# Patient Record
Sex: Male | Born: 1980 | Race: Black or African American | Hispanic: No | Marital: Married | Smoking: Former smoker
Health system: Southern US, Community
[De-identification: ages and names within clinical notes are randomized; demographics above are authoritative.]

## PROBLEM LIST (undated history)

## (undated) DIAGNOSIS — F17200 Nicotine dependence, unspecified, uncomplicated: Secondary | ICD-10-CM

## (undated) HISTORY — DX: Nicotine dependence, unspecified, uncomplicated: F17.200

## (undated) HISTORY — PX: TYMPANOSTOMY TUBE PLACEMENT: SHX32

---
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
## 2000-05-14 ENCOUNTER — Encounter: Admission: RE | Admit: 2000-05-14 | Discharge: 2000-05-14 | Payer: Self-pay | Admitting: Family Medicine

## 2000-09-24 ENCOUNTER — Encounter: Admission: RE | Admit: 2000-09-24 | Discharge: 2000-09-24 | Payer: Self-pay | Admitting: Family Medicine

## 2007-01-09 ENCOUNTER — Encounter: Admission: RE | Admit: 2007-01-09 | Discharge: 2007-01-09 | Payer: Self-pay | Admitting: Gastroenterology

## 2007-01-23 ENCOUNTER — Emergency Department (HOSPITAL_COMMUNITY): Admission: EM | Admit: 2007-01-23 | Discharge: 2007-01-23 | Payer: Self-pay | Admitting: Emergency Medicine

## 2007-10-17 ENCOUNTER — Emergency Department (HOSPITAL_COMMUNITY): Admission: EM | Admit: 2007-10-17 | Discharge: 2007-10-17 | Payer: Self-pay | Admitting: Emergency Medicine

## 2007-11-05 ENCOUNTER — Encounter (HOSPITAL_COMMUNITY): Admission: RE | Admit: 2007-11-05 | Discharge: 2007-12-03 | Payer: Self-pay | Admitting: Emergency Medicine

## 2007-11-08 ENCOUNTER — Emergency Department (HOSPITAL_COMMUNITY): Admission: EM | Admit: 2007-11-08 | Discharge: 2007-11-08 | Payer: Self-pay | Admitting: Emergency Medicine

## 2008-03-16 ENCOUNTER — Emergency Department (HOSPITAL_COMMUNITY): Admission: EM | Admit: 2008-03-16 | Discharge: 2008-03-16 | Payer: Self-pay | Admitting: Emergency Medicine

## 2013-04-15 ENCOUNTER — Other Ambulatory Visit (INDEPENDENT_AMBULATORY_CARE_PROVIDER_SITE_OTHER): Payer: 59

## 2013-04-15 DIAGNOSIS — K219 Gastro-esophageal reflux disease without esophagitis: Secondary | ICD-10-CM

## 2013-04-15 DIAGNOSIS — Z0000 Encounter for general adult medical examination without abnormal findings: Secondary | ICD-10-CM

## 2013-04-16 ENCOUNTER — Ambulatory Visit (INDEPENDENT_AMBULATORY_CARE_PROVIDER_SITE_OTHER): Payer: 59 | Admitting: Family Medicine

## 2013-04-16 ENCOUNTER — Encounter: Payer: Self-pay | Admitting: Family Medicine

## 2013-04-16 VITALS — BP 130/80 | HR 62 | Temp 97.6°F | Resp 14 | Ht 71.0 in | Wt 199.0 lb

## 2013-04-16 DIAGNOSIS — Z0000 Encounter for general adult medical examination without abnormal findings: Secondary | ICD-10-CM

## 2013-04-16 DIAGNOSIS — F17200 Nicotine dependence, unspecified, uncomplicated: Secondary | ICD-10-CM | POA: Insufficient documentation

## 2013-04-16 LAB — COMPLETE METABOLIC PANEL WITH GFR
ALT: 27 U/L (ref 0–53)
AST: 35 U/L (ref 0–37)
Alkaline Phosphatase: 58 U/L (ref 39–117)
BUN: 12 mg/dL (ref 6–23)
CO2: 28 mEq/L (ref 19–32)
Calcium: 9.5 mg/dL (ref 8.4–10.5)
GFR, Est Non African American: 86 mL/min
Glucose, Bld: 86 mg/dL (ref 70–99)
Potassium: 5 mEq/L (ref 3.5–5.3)
Sodium: 140 mEq/L (ref 135–145)
Total Bilirubin: 1 mg/dL (ref 0.3–1.2)

## 2013-04-16 LAB — CBC WITH DIFFERENTIAL/PLATELET
Eosinophils Absolute: 0.2 10*3/uL (ref 0.0–0.7)
Hemoglobin: 15.5 g/dL (ref 13.0–17.0)
Lymphocytes Relative: 34 % (ref 12–46)
MCH: 30.8 pg (ref 26.0–34.0)
MCHC: 34.7 g/dL (ref 30.0–36.0)
Monocytes Relative: 8 % (ref 3–12)
Neutro Abs: 3.3 10*3/uL (ref 1.7–7.7)
Neutrophils Relative %: 54 % (ref 43–77)
RDW: 13.1 % (ref 11.5–15.5)

## 2013-04-16 LAB — LIPID PANEL
HDL: 57 mg/dL (ref >39)
LDL Cholesterol: 71 mg/dL (ref 0–99)
VLDL: 14 mg/dL (ref 0–40)

## 2013-04-16 NOTE — Progress Notes (Signed)
Subjective:    Patient ID: Nicholas Lee, male    DOB: May 08, 1981, 32 y.o.   MRN: [id number]  HPI  Here today for complete physical exam. The patient denies any current medical concerns. He is feeling well. He did have blood work drawn yesterday prior to the physical which is shown below. Lab on 04/15/2013  Component Date Value Range Status  . WBC 04/15/2013 6.0  4.0 - 10.5 K/uL Final  . RBC 04/15/2013 5.03  4.22 - 5.81 MIL/uL Final  . Hemoglobin 04/15/2013 15.5  13.0 - 17.0 g/dL Final  . HCT 40/98/1191 44.7  39.0 - 52.0 % Final  . MCV 04/15/2013 88.9  78.0 - 100.0 fL Final  . MCH 04/15/2013 30.8  26.0 - 34.0 pg Final  . MCHC 04/15/2013 34.7  30.0 - 36.0 g/dL Final  . RDW 47/82/9562 13.1  11.5 - 15.5 % Final  . Platelets 04/15/2013 146* 150 - 400 K/uL Final  . Neutrophils Relative % 04/15/2013 54  43 - 77 % Final  . Neutro Abs 04/15/2013 3.3  1.7 - 7.7 K/uL Final  . Lymphocytes Relative 04/15/2013 34  12 - 46 % Final  . Lymphs Abs 04/15/2013 2.1  0.7 - 4.0 K/uL Final  . Monocytes Relative 04/15/2013 8  3 - 12 % Final  . Monocytes Absolute 04/15/2013 0.5  0.1 - 1.0 K/uL Final  . Eosinophils Relative 04/15/2013 3  0 - 5 % Final  . Eosinophils Absolute 04/15/2013 0.2  0.0 - 0.7 K/uL Final  . Basophils Relative 04/15/2013 1  0 - 1 % Final  . Basophils Absolute 04/15/2013 0.0  0.0 - 0.1 K/uL Final  . Smear Review 04/15/2013 Criteria for review not met   Final  . Sodium 04/15/2013 140  135 - 145 mEq/L Final  . Potassium 04/15/2013 5.0  3.5 - 5.3 mEq/L Final  . Chloride 04/15/2013 104  96 - 112 mEq/L Final  . CO2 04/15/2013 28  19 - 32 mEq/L Final  . Glucose, Bld 04/15/2013 86  70 - 99 mg/dL Final  . BUN 13/06/6577 12  6 - 23 mg/dL Final  . Creat 46/96/2952 1.13  0.50 - 1.35 mg/dL Final  . Total Bilirubin 04/15/2013 1.0  0.3 - 1.2 mg/dL Final  . Alkaline Phosphatase 04/15/2013 58  39 - 117 U/L Final  . AST 04/15/2013 35  0 - 37 U/L Final  . ALT 04/15/2013 27  0 - 53 U/L Final  . Total  Protein 04/15/2013 6.6  6.0 - 8.3 g/dL Final  . Albumin 84/13/2440 4.4  3.5 - 5.2 g/dL Final  . Calcium 09/15/2535 9.5  8.4 - 10.5 mg/dL Final  . GFR, Est African American 04/15/2013 >89   Final  . GFR, Est Non African American 04/15/2013 86   Final   Comment:                            The estimated GFR is a calculation valid for adults (>=29 years old)                          that uses the CKD-EPI algorithm to adjust for age and sex. It is                            not to be used for children, pregnant women, hospitalized patients,  patients on dialysis, or with rapidly changing kidney function.                          According to the NKDEP, eGFR >89 is normal, 60-89 shows mild                          impairment, 30-59 shows moderate impairment, 15-29 shows severe                          impairment and <15 is ESRD.                             Marland Kitchen Cholesterol 04/15/2013 142  0 - 200 mg/dL Final   Comment: ATP III Classification:                                < 200        mg/dL        Desirable                               200 - 239     mg/dL        Borderline High                               >= 240        mg/dL        High                             . Triglycerides 04/15/2013 68  <150 mg/dL Final  . HDL 14/78/2956 57  >39 mg/dL Final  . Total CHOL/HDL Ratio 04/15/2013 2.5   Final  . VLDL 04/15/2013 14  0 - 40 mg/dL Final  . LDL Cholesterol 04/15/2013 71  0 - 99 mg/dL Final   Comment:                            Total Cholesterol/HDL Ratio:CHD Risk                                                 Coronary Heart Disease Risk Table                                                                 Men       Women                                   1/2 Average Risk              3.4        3.3  Average Risk              5.0        4.4                                    2X Average Risk              9.6        7.1                                     3X Average Risk             23.4       11.0                          Use the calculated Patient Ratio above and the CHD Risk table                           to determine the patient's CHD Risk.                          ATP III Classification (LDL):                                < 100        mg/dL         Optimal                               100 - 129     mg/dL         Near or Above Optimal                               130 - 159     mg/dL         Borderline High                               160 - 189     mg/dL         High                                > 190        mg/dL         Very High                              Past Medical History  Diagnosis Date  . Smoker    No current outpatient prescriptions on file prior to visit.   No current facility-administered medications on file prior to visit.   No Known Allergies History   Social History  . Marital Status: Single    Spouse Name: N/A    Number of Children: N/A  . Years of Education: N/A   Occupational History  . Not on file.   Social History Main Topics  . Smoking status:  Current Every Day Smoker    Types: Cigarettes  . Smokeless tobacco: Not on file     Comment: 1 pack per week  . Alcohol Use: Yes     Comment: 3-4 days per week  . Drug Use: No  . Sexually Active: Yes     Comment: married, works at [employer] in [employer]   Other Topics Concern  . Not on file   Social History Narrative  . No narrative on file   Family History  Problem Relation Age of Onset  . Heart disease Maternal Grandfather     14 y/o      Review of Systems  All other systems reviewed and are negative.       Objective:   Physical Exam  Vitals reviewed. Constitutional: He is oriented to person, place, and time. He appears well-developed and well-nourished.  HENT:  Head: Normocephalic and atraumatic.  Right Ear: External ear normal.  Left Ear: External ear normal.  Nose: Nose normal.   Mouth/Throat: Oropharynx is clear and moist. No oropharyngeal exudate.  Eyes: Conjunctivae and EOM are normal. Pupils are equal, round, and reactive to light. Right eye exhibits no discharge. Left eye exhibits no discharge. No scleral icterus.  Neck: Normal range of motion. Neck supple. No JVD present. No thyromegaly present.  Cardiovascular: Normal rate, regular rhythm and normal heart sounds.  Exam reveals no gallop and no friction rub.   No murmur heard. Pulmonary/Chest: Effort normal and breath sounds normal. No respiratory distress. He has no wheezes. He has no rales. He exhibits no tenderness.  Abdominal: Soft. Bowel sounds are normal. He exhibits no distension and no mass. There is no tenderness. There is no rebound and no guarding.  Musculoskeletal: Normal range of motion. He exhibits no edema and no tenderness.  Lymphadenopathy:    He has no cervical adenopathy.  Neurological: He is alert and oriented to person, place, and time. He has normal reflexes. He displays normal reflexes. No cranial nerve deficit. He exhibits normal muscle tone. Coordination normal.  Skin: Skin is warm and dry. No rash noted. No erythema. No pallor.  Psychiatric: He has a normal mood and affect. His behavior is normal. Judgment and thought content normal.          Assessment & Plan:  1. Routine general medical examination at a health care facility Patient's physical exam is normal. We went over his lab work in detail. His labs were significant only for a platelet count of 146. I recommended we check this annually. His immunizations are up to date.  He is due for his next tetanus shot in 5 years as he stated he had had 15 years ago. I recommended annual flu shot. His physical exam is otherwise normal. Followup again in one year.

## 2013-12-25 ENCOUNTER — Ambulatory Visit: Payer: 59 | Admitting: Family Medicine

## 2014-06-17 ENCOUNTER — Emergency Department (HOSPITAL_COMMUNITY)
Admission: EM | Admit: 2014-06-17 | Discharge: 2014-06-17 | Disposition: A | Discharge status: Home / Self Care | Payer: 59 | Source: Home / Self Care

## 2014-06-17 ENCOUNTER — Encounter (HOSPITAL_COMMUNITY): Payer: Self-pay | Admitting: Emergency Medicine

## 2014-06-17 DIAGNOSIS — K089 Disorder of teeth and supporting structures, unspecified: Secondary | ICD-10-CM

## 2014-06-17 DIAGNOSIS — K0889 Other specified disorders of teeth and supporting structures: Secondary | ICD-10-CM

## 2014-06-17 DIAGNOSIS — K044 Acute apical periodontitis of pulpal origin: Secondary | ICD-10-CM

## 2014-06-17 DIAGNOSIS — K047 Periapical abscess without sinus: Secondary | ICD-10-CM

## 2014-06-17 MED ORDER — AMOXICILLIN 500 MG PO CAPS: 1000.0000 mg | ORAL_CAPSULE | Freq: Two times a day (BID) | ORAL | Status: DC

## 2014-06-17 MED ORDER — HYDROCODONE-ACETAMINOPHEN 5-325 MG PO TABS: 1.0000 | ORAL_TABLET | ORAL | Status: DC | PRN

## 2014-06-17 NOTE — ED Notes (Signed)
Plan   Of  Care  Discussed   With  Pt   Importance  Of  Followup  With  A   Dentist      Reviewed  With  The  Pt

## 2014-06-17 NOTE — ED Notes (Signed)
Pt  Reports  Toothache  Which  Started  Yesterday      His  Face  Is  Now  Swollen     He  States  He  Called  A  Dentist  Who  Stated  They  Could  Not  See  Him   Till   The   Swelling  Goes   Down

## 2014-06-17 NOTE — ED Provider Notes (Signed)
CSN: [id number]     Arrival date & time 06/17/14  1217 History   First MD Initiated Contact with Patient 06/17/14 1302     Chief Complaint  Patient presents with  . Oral Swelling   (Consider location/radiation/quality/duration/timing/severity/associated sxs/prior Treatment) HPI Comments: 33 year old male complaining of swelling to the left side of the face associated with left lower third molar pain since yesterday.   Past Medical History  Diagnosis Date  . Smoker    Past Surgical History  Procedure Laterality Date  . Tympanostomy tube placement      left ear in 90's   Family History  Problem Relation Age of Onset  . Heart disease Maternal Grandfather     33 y/o   History  Substance Use Topics  . Smoking status: Current Every Day Smoker    Types: Cigarettes  . Smokeless tobacco: Not on file     Comment: 1 pack per week  . Alcohol Use: Yes     Comment: 3-4 days per week    Review of Systems  Constitutional: Negative.   HENT: Positive for dental problem. Negative for sore throat and trouble swallowing.   All other systems reviewed and are negative.   Allergies  Review of patient's allergies indicates no known allergies.  Home Medications   Prior to Admission medications   Medication Sig Start Date End Date Taking? Authorizing Provider  amoxicillin (AMOXIL) 500 MG capsule Take 2 capsules (1,000 mg total) by mouth 2 (two) times daily. 06/17/14   Hayden Rasmussenavid Galia Rahm, NP  HYDROcodone-acetaminophen (NORCO/VICODIN) 5-325 MG per tablet Take 1 tablet by mouth every 4 (four) hours as needed. 06/17/14   Hayden Rasmussenavid Mychelle Kendra, NP   BP 133/92  Pulse 100  Temp(Src) 99 F (37.2 C) (Oral)  Resp 12  SpO2 97% Physical Exam  Nursing note and vitals reviewed. Constitutional: He is oriented to person, place, and time. He appears well-developed and well-nourished. No distress.  HENT:  Mouth/Throat: Oropharynx is clear and moist. No oropharyngeal exudate.  Left lower third molar with large occlusal  surface cavity and abrasion. Associated swelling adjacent to the tooth that extends into the left face. No abscess is visible.  Eyes: Conjunctivae and EOM are normal.  Neck: Normal range of motion. Neck supple.  Pulmonary/Chest: Effort normal. No respiratory distress.  Lymphadenopathy:    He has no cervical adenopathy.  Neurological: He is alert and oriented to person, place, and time.  Skin: Skin is warm and dry.  Psychiatric: He has a normal mood and affect.    ED Course  Procedures (including critical care time) Labs Review Labs Reviewed - No data to display  Imaging Review No results found.   MDM   1. Toothache   2. Tooth infection    amoxiciillin norco 5mg  #15 See dentist as soon as possible    Hayden Rasmussenavid Willona Phariss, NP 06/17/14 1315

## 2014-06-18 NOTE — ED Provider Notes (Signed)
Medical screening examination/treatment/procedure(s) were performed by a resident physician or non-physician practitioner and as the supervising physician I was immediately available for consultation/collaboration.  Solace Manwarren, MD    Ashia Dehner S Anadelia Kintz, MD 06/18/14 0744 

## 2014-06-19 ENCOUNTER — Other Ambulatory Visit (HOSPITAL_COMMUNITY): Payer: 59

## 2014-06-19 ENCOUNTER — Inpatient Hospital Stay (HOSPITAL_COMMUNITY)
Admission: EM | Admit: 2014-06-19 | Discharge: 2014-06-21 | DRG: 159 | Disposition: A | Discharge status: Home / Self Care | Payer: 59 | Attending: Internal Medicine | Admitting: Internal Medicine

## 2014-06-19 ENCOUNTER — Emergency Department (HOSPITAL_COMMUNITY): Payer: 59

## 2014-06-19 ENCOUNTER — Encounter (HOSPITAL_COMMUNITY): Payer: Self-pay | Admitting: Emergency Medicine

## 2014-06-19 DIAGNOSIS — F17200 Nicotine dependence, unspecified, uncomplicated: Secondary | ICD-10-CM | POA: Diagnosis present

## 2014-06-19 DIAGNOSIS — R221 Localized swelling, mass and lump, neck: Secondary | ICD-10-CM | POA: Diagnosis present

## 2014-06-19 DIAGNOSIS — K047 Periapical abscess without sinus: Secondary | ICD-10-CM | POA: Diagnosis present

## 2014-06-19 DIAGNOSIS — K1230 Oral mucositis (ulcerative), unspecified: Secondary | ICD-10-CM

## 2014-06-19 DIAGNOSIS — E860 Dehydration: Secondary | ICD-10-CM | POA: Diagnosis present

## 2014-06-19 DIAGNOSIS — Z720 Tobacco use: Secondary | ICD-10-CM

## 2014-06-19 DIAGNOSIS — K121 Other forms of stomatitis: Secondary | ICD-10-CM | POA: Diagnosis present

## 2014-06-19 DIAGNOSIS — Z8249 Family history of ischemic heart disease and other diseases of the circulatory system: Secondary | ICD-10-CM | POA: Diagnosis not present

## 2014-06-19 DIAGNOSIS — R220 Localized swelling, mass and lump, head: Secondary | ICD-10-CM | POA: Diagnosis present

## 2014-06-19 LAB — CBC WITH DIFFERENTIAL/PLATELET
BASOS PCT: 0 % (ref 0–1)
Basophils Absolute: 0 10*3/uL (ref 0.0–0.1)
Eosinophils Absolute: 0 10*3/uL (ref 0.0–0.7)
Eosinophils Relative: 0 % (ref 0–5)
HCT: 41.8 % (ref 39.0–52.0)
HEMOGLOBIN: 15.2 g/dL (ref 13.0–17.0)
LYMPHS ABS: 1.4 10*3/uL (ref 0.7–4.0)
LYMPHS PCT: 15 % (ref 12–46)
MCH: 32.2 pg (ref 26.0–34.0)
MCHC: 36.4 g/dL — AB (ref 30.0–36.0)
MCV: 88.6 fL (ref 78.0–100.0)
MONOS PCT: 12 % (ref 3–12)
Monocytes Absolute: 1.1 10*3/uL — ABNORMAL HIGH (ref 0.1–1.0)
NEUTROS ABS: 6.6 10*3/uL (ref 1.7–7.7)
Neutrophils Relative %: 73 % (ref 43–77)
PLATELETS: 127 10*3/uL — AB (ref 150–400)
RBC: 4.72 MIL/uL (ref 4.22–5.81)
RDW: 11.9 % (ref 11.5–15.5)
WBC: 9.1 10*3/uL (ref 4.0–10.5)

## 2014-06-19 LAB — I-STAT CHEM 8, ED
BUN: 16 mg/dL (ref 6–23)
CALCIUM ION: 1.15 mmol/L (ref 1.12–1.23)
CHLORIDE: 98 meq/L (ref 96–112)
CREATININE: 1 mg/dL (ref 0.50–1.35)
Glucose, Bld: 94 mg/dL (ref 70–99)
HCT: 48 % (ref 39.0–52.0)
HEMOGLOBIN: 16.3 g/dL (ref 13.0–17.0)
Potassium: 4.1 mEq/L (ref 3.7–5.3)
Sodium: 137 mEq/L (ref 137–147)
TCO2: 27 mmol/L (ref 0–100)

## 2014-06-19 MED ORDER — ACETAMINOPHEN 650 MG RE SUPP: 650.0000 mg | Freq: Four times a day (QID) | RECTAL | Status: DC | PRN

## 2014-06-19 MED ORDER — SODIUM CHLORIDE 0.9 % IV BOLUS (SEPSIS)
1000.0000 mL | Freq: Once | INTRAVENOUS | Status: AC
  Administered 2014-06-19: 1000 mL via INTRAVENOUS

## 2014-06-19 MED ORDER — CLINDAMYCIN PHOSPHATE 600 MG/50ML IV SOLN
600.0000 mg | Freq: Once | INTRAVENOUS | Status: AC
  Administered 2014-06-19: 600 mg via INTRAVENOUS
  Filled 2014-06-19: qty 50

## 2014-06-19 MED ORDER — OXYCODONE HCL 5 MG PO TABS
5.0000 mg | ORAL_TABLET | ORAL | Status: DC | PRN
  Administered 2014-06-20 – 2014-06-21 (×2): 5 mg via ORAL
  Filled 2014-06-19 (×3): qty 1

## 2014-06-19 MED ORDER — IBUPROFEN 400 MG PO TABS
400.0000 mg | ORAL_TABLET | Freq: Three times a day (TID) | ORAL | Status: DC
  Administered 2014-06-19 – 2014-06-21 (×6): 400 mg via ORAL
  Filled 2014-06-19 (×11): qty 1

## 2014-06-19 MED ORDER — SODIUM CHLORIDE 0.9 % IV SOLN
3.0000 g | Freq: Four times a day (QID) | INTRAVENOUS | Status: DC
  Administered 2014-06-19 – 2014-06-21 (×8): 3 g via INTRAVENOUS
  Filled 2014-06-19 (×10): qty 3

## 2014-06-19 MED ORDER — ONDANSETRON HCL 4 MG/2ML IJ SOLN: 4.0000 mg | Freq: Four times a day (QID) | INTRAMUSCULAR | Status: DC | PRN

## 2014-06-19 MED ORDER — DOCUSATE SODIUM 100 MG PO CAPS
100.0000 mg | ORAL_CAPSULE | Freq: Two times a day (BID) | ORAL | Status: DC
  Administered 2014-06-19 – 2014-06-21 (×3): 100 mg via ORAL
  Filled 2014-06-19 (×4): qty 1

## 2014-06-19 MED ORDER — HEPARIN SODIUM (PORCINE) 5000 UNIT/ML IJ SOLN
5000.0000 [IU] | Freq: Three times a day (TID) | INTRAMUSCULAR | Status: DC
  Filled 2014-06-19 (×8): qty 1

## 2014-06-19 MED ORDER — SODIUM CHLORIDE 0.9 % IV SOLN: INTRAVENOUS | Status: AC

## 2014-06-19 MED ORDER — HYDROMORPHONE HCL PF 1 MG/ML IJ SOLN
1.0000 mg | INTRAMUSCULAR | Status: AC | PRN
  Administered 2014-06-19 (×2): 1 mg via INTRAVENOUS
  Filled 2014-06-19 (×2): qty 1

## 2014-06-19 MED ORDER — ALBUTEROL SULFATE (2.5 MG/3ML) 0.083% IN NEBU: 2.5000 mg | INHALATION_SOLUTION | RESPIRATORY_TRACT | Status: DC | PRN

## 2014-06-19 MED ORDER — ACETAMINOPHEN 325 MG PO TABS
650.0000 mg | ORAL_TABLET | Freq: Four times a day (QID) | ORAL | Status: DC | PRN
  Administered 2014-06-19: 650 mg via ORAL
  Filled 2014-06-19: qty 2

## 2014-06-19 MED ORDER — IOHEXOL 300 MG/ML  SOLN
75.0000 mL | Freq: Once | INTRAMUSCULAR | Status: AC | PRN
  Administered 2014-06-19: 75 mL via INTRAVENOUS

## 2014-06-19 MED ORDER — MORPHINE SULFATE 2 MG/ML IJ SOLN
1.0000 mg | INTRAMUSCULAR | Status: DC | PRN
  Administered 2014-06-19 – 2014-06-20 (×5): 1 mg via INTRAVENOUS
  Filled 2014-06-19 (×6): qty 1

## 2014-06-19 MED ORDER — SODIUM CHLORIDE 0.9 % IV SOLN
INTRAVENOUS | Status: DC
  Administered 2014-06-19 – 2014-06-20 (×2): via INTRAVENOUS

## 2014-06-19 MED ORDER — ONDANSETRON HCL 4 MG PO TABS: 4.0000 mg | ORAL_TABLET | Freq: Four times a day (QID) | ORAL | Status: DC | PRN

## 2014-06-19 MED ORDER — ONDANSETRON HCL 4 MG/2ML IJ SOLN
4.0000 mg | Freq: Three times a day (TID) | INTRAMUSCULAR | Status: DC | PRN
Start: 2014-06-19 — End: 2014-06-19

## 2014-06-19 NOTE — ED Notes (Signed)
[facility] [place] [facility] [address] but could not due to the infection.

## 2014-06-19 NOTE — ED Notes (Signed)
Pt. Stated, I'm having dental pain. I was in [facility] on thursday and given antibiotic and not helping and Im not able to go to work.

## 2014-06-19 NOTE — ED Provider Notes (Signed)
CSN: [id number]     Arrival date & time 06/19/14  1003 History  This chart was scribed for non-physician practitioner working with Shanna CiscoMegan E Docherty, MD, by Modena JanskyAlbert Thayil, ED Scribe. This patient was seen in room TR10C/TR10C and the patient's care was started at 10:20 AM.     Chief Complaint  Patient presents with  . Dental Pain   The history is provided by the patient.   HPI Comments: Nicholas Lee is a 33 y.o. male who presents to the Emergency Department complaining of constant moderate dental pain that started 3 days ago. He reports that he went to Urgent Care 2 days ago and was treated with an antibiotic, Amoxicillin. He states that the antibiotic provided no relief and is not able to go to work. He states that he has a dental appointment scheduled for the 13th of this month. He currenlty rates the severity of the pain as a 5/10. He describes the pain as a throbbing feeling. He states that the pain is worse at night. He reports that he has difficultly swallowing due to associated swelling.  Reports swelling is in the side of his face and the floor of his mouth.  Is unable to lower his jaw enough to eat or drink properly.  He states that he had a fever of 101 and a sore throat.   Past Medical History  Diagnosis Date  . Smoker    Past Surgical History  Procedure Laterality Date  . Tympanostomy tube placement      left ear in 90's   Family History  Problem Relation Age of Onset  . Heart disease Maternal Grandfather     33 y/o   History  Substance Use Topics  . Smoking status: Current Every Day Smoker    Types: Cigarettes  . Smokeless tobacco: Not on file     Comment: 1 pack per week  . Alcohol Use: Yes     Comment: 3-4 days per week    Review of Systems  Constitutional: Positive for fever.  HENT: Positive for dental problem, facial swelling, sore throat and trouble swallowing.   Respiratory: Negative for choking and shortness of breath.   Musculoskeletal: Negative for neck pain  and neck stiffness.  Skin: Negative for color change.  Allergic/Immunologic: Negative for immunocompromised state.  Neurological: Negative for headaches.  Hematological: Does not bruise/bleed easily.  All other systems reviewed and are negative.   Allergies  Review of patient's allergies indicates no known allergies.  Home Medications   Prior to Admission medications   Medication Sig Start Date End Date Taking? Authorizing Provider  amoxicillin (AMOXIL) 500 MG capsule Take 2 capsules (1,000 mg total) by mouth 2 (two) times daily. 06/17/14   Hayden Rasmussenavid Mabe, NP  HYDROcodone-acetaminophen (NORCO/VICODIN) 5-325 MG per tablet Take 1 tablet by mouth every 4 (four) hours as needed. 06/17/14   Hayden Rasmussenavid Mabe, NP   BP 137/87  Pulse 113  Temp(Src) 99.2 F (37.3 C) (Oral)  Resp 17  Ht 6' (1.829 m)  Wt 186 lb (84.369 kg)  BMI 25.22 kg/m2  SpO2 98% Physical Exam  Nursing note and vitals reviewed. Constitutional: He appears well-developed and well-nourished. No distress.  HENT:  Head: Normocephalic and atraumatic.  + Trismus, unable to lower jaw more than a few cm.  Oral exam somewhat limited secondary to this. Edema and tenderness over left mandible. Submandibular fullness and tenderness.  Eyes: Conjunctivae are normal.  Neck: Normal range of motion. Neck supple.  Cardiovascular: Normal rate.  Pulmonary/Chest: Effort normal. No stridor. He has no wheezes.  Neurological: He is alert.  Skin: He is not diaphoretic.    ED Course  Procedures (including critical care time) DIAGNOSTIC STUDIES: Oxygen Saturation is 98% on RA, normal by my interpretation.    COORDINATION OF CARE: 10:24 AM- Pt advised of plan for treatment which includes medication, radiology, and labs and pt agrees. 10:27 AM- Pt currently declines pain medication.   Labs Review Labs Reviewed  CBC WITH DIFFERENTIAL - Abnormal; Notable for the following:    MCHC 36.4 (*)    Platelets 127 (*)    Monocytes Absolute 1.1 (*)     All other components within normal limits  I-STAT CHEM 8, ED    Imaging Review Ct Soft Tissue Neck W Contrast  06/19/2014   CLINICAL DATA:  Left lower dental abscess, trismus, oral floor swelling and submandibular swelling.  EXAM: CT NECK WITH CONTRAST  TECHNIQUE: Multidetector CT imaging of the neck was performed using the standard protocol following the bolus administration of intravenous contrast.  CONTRAST:  75mL OMNIPAQUE IOHEXOL 300 MG/ML  SOLN  COMPARISON:  None.  FINDINGS: The visualized portions of the brain and orbits are unremarkable. Visualized paranasal sinuses are clear. Right mastoid air cells are clear. There is evidence of prior left canal wall down mastoidectomy with soft tissue in the mastoidectomy bowl and opacification of a few small residual mastoid air cells inferiorly.  The nasopharynx is unremarkable. There is mild asymmetric soft tissue fullness of the left tonsillar pillar without a mass seen. Left mandibular second molar caries are noted with mild surrounding lucency in the alveolar process of the mandible and tiny, 7 x 3 mm medial subperiosteal gas and fluid collection (series 207, image 52 and series 203, image 32). Inflammatory changes extend into the left floor of mouth and left submandibular space without drainable fluid collection seen. There is also left parapharyngeal edema.  Enlarged left submandibular lymph nodes measure up to 1.2 cm in short axis, likely reactive. Submental lymph nodes measure up to 8 mm in short axis. Left level II lymph nodes measure up to 1.1 cm. Subcentimeter, mildly asymmetric left level III and IV lymph nodes are also noted as well as mildly asymmetric left supraclavicular lymph nodes.  Thyroid, right submandibular gland, and both parotid glands are unremarkable. Mildly low attenuation of the left submandibular gland may reflect mild edema related to adjacent infectious process. Major vascular structures of the neck appear patent. Visualized lung  apices are clear. Cervical spine is unremarkable.  IMPRESSION: Inflammation in the left submandibular space and floor of mouth consistent with infection of dental origin arising from the left mandibular second molar. There is a 7 x 3 mm subperiosteal abscess along the lingual surface of the mandible.   Electronically Signed   By: Sebastian Ache   On: 06/19/2014 12:50     EKG Interpretation None      11:32 AM Dr Micheline Maze made aware of the patient.   1:38 PM Discussed pt with Dr Barbette Merino.   Recommends switching patient to unasyn, observing overnight in hospital.  He will be available for any worsening condition.     MDM   Final diagnoses:  Dental abscess   Pt with hx fevers, nontoxic appearance, presents with worsening dental abscess.  Tachycardic likely secondary to infection, pain, and mild dehydration.  Pt declined pain medication.  IVF, IV clindamycin given.  Clinically no airway concerns in ED- No SOB,  no stridor, handling oral secretions without  difficulty.  CT neck w contrast ordered given concern for deep space infection.  CT shows small abscess and large area of inflammation (please see results).  Discussed with Dr Barbette Merino, oral surgery.  Plan for admission to medicine for overnight for IV antibiotics and observation.  Admitted to [facility].      I personally performed the services described in this documentation, which was scribed in my presence. The recorded information has been reviewed and is accurate.   [place], PA-C 06/19/14 [phone]

## 2014-06-19 NOTE — ED Notes (Signed)
Returned from ct 

## 2014-06-19 NOTE — Progress Notes (Signed)
ANTIBIOTIC CONSULT NOTE - INITIAL  Pharmacy Consult for Unasyn Indication: Dental infection  No Known Allergies  Patient Measurements: Height: 6' (182.9 cm) Weight: 186 lb (84.369 kg) IBW/kg (Calculated) : 77.6 Adjusted Body Weight:   Vital Signs: Temp: 100.5 F (38.1 C) (08/01 1456) Temp src: Oral (08/01 1456) BP: 146/89 mmHg (08/01 1456) Pulse Rate: 78 (08/01 1456) Intake/Output from previous day:   Intake/Output from this shift:    Labs:  Recent Labs  06/19/14 1053 06/19/14 1115  WBC  --  9.1  HGB 16.3 15.2  PLT  --  127*  CREATININE 1.00  --    Estimated Creatinine Clearance: 115.3 ml/min (by C-G formula based on Cr of 1). No results found for this basename: VANCOTROUGH, VANCOPEAK, VANCORANDOM, GENTTROUGH, GENTPEAK, GENTRANDOM, TOBRATROUGH, TOBRAPEAK, TOBRARND, AMIKACINPEAK, AMIKACINTROU, AMIKACIN,  in the last 72 hours   Microbiology: No results found for this or any previous visit (from the past 720 hour(s)).  Medical History: Past Medical History  Diagnosis Date  . Smoker     Medications:  Scheduled:  . sodium chloride   Intravenous STAT  . ampicillin-sulbactam (UNASYN) IV  3 g Intravenous Q6H  . docusate sodium  100 mg Oral BID  . heparin  5,000 Units Subcutaneous 3 times per day  . ibuprofen  400 mg Oral TID   Infusions:  . sodium chloride     Assessment: 33 yo who came in facial swelling. Unasyn is now being started for dental abscess.   Goal of Therapy:  Appropriate dose  Plan:   Unasyn 3g IV q6  Ulyses SouthwardMinh Pham, PharmD Pager: [phone] 06/19/2014 3:10 PM

## 2014-06-19 NOTE — H&P (Signed)
[facility] History and Physical  Nicholas Melnickric A Hagberg ZOX:[id number] DOB: 07/28/1981 DOA: 06/19/2014   PCP: He has no PCP Specialists: None  Chief Complaint: Facial swelling  HPI: Nicholas Lee is a 33 y.o. male with no significant past medical history except that he smokes who was in his usual state of health this past Wednesday, when he started experiencing pain in his mouth and the left side of his face. He started noticing swelling in the left side of his face. Subsequently, his throat started swelling. He had difficulty eating. He had difficulty swallowing initially solids and subsequently liquids. He had decreased appetite. He had fever up to 101F. Had chills. Pain was 6-7/10 in intensity. It is a sharp pain without any radiation. He's never had this problem before. Denies any shortness of breath or chest pains. No nausea, vomiting, diarrhea. He went to the urgent care Center on Thursday, and was diagnosed with a dental abscess and was prescribed amoxicillin. He hasn't experienced any improvement so, he decided to come back to the hospital today. He denies any drooling of saliva. He has been able to swallow water. However, it is quite painful.  Home Medications: Prior to Admission medications   Medication Sig Start Date End Date Taking? Authorizing Provider  amoxicillin (AMOXIL) 500 MG capsule Take 2 capsules (1,000 mg total) by mouth 2 (two) times daily. 06/17/14  Yes Hayden Rasmussenavid Mabe, NP  GINSENG PO Take 1 Bottle by mouth once a week.   Yes Historical Provider, MD  HYDROcodone-acetaminophen (NORCO/VICODIN) 5-325 MG per tablet Take 1 tablet by mouth every 4 (four) hours as needed. 06/17/14  Yes Hayden Rasmussenavid Mabe, NP  Multiple Vitamin (MULTIVITAMIN WITH MINERALS) TABS tablet Take 1 tablet by mouth daily.   Yes Historical Provider, MD    Allergies: No Known Allergies  Past Medical History: Past Medical History  Diagnosis Date  . Smoker     Past Surgical History  Procedure Laterality Date  .  Tympanostomy tube placement      left ear in 90's    Social History: [place]. He as a Estate agentforklift operator. Smokes 5 cigarettes a day. 12 packs of beer over a week. Last alcohol consumption was on Monday. Independent with daily activities.  Family History:  Family History  Problem Relation Age of Onset  . Heart disease Maternal Grandfather     33 y/o  . Heart disease Mother      Review of Systems - History obtained from the patient General ROS: positive for  - chills, fatigue and fever Psychological ROS: negative Ophthalmic ROS: negative ENT ROS: negative Allergy and Immunology ROS: negative Hematological and Lymphatic ROS: negative Endocrine ROS: negative Respiratory ROS: no cough, shortness of breath, or wheezing Cardiovascular ROS: no chest pain or dyspnea on exertion Gastrointestinal ROS: no abdominal pain, change in bowel habits, or black or bloody stools Genito-Urinary ROS: no dysuria, trouble voiding, or hematuria Musculoskeletal ROS: negative Neurological ROS: no TIA or stroke symptoms Dermatological ROS: negative  Physical Examination  Filed Vitals:   06/19/14 1215 06/19/14 1230 06/19/14 1315 06/19/14 1347  BP: 135/84 157/83 138/83 137/81  Pulse: 81 83 79   Temp:      TempSrc:      Resp: 18 17 17 18   Height:      Weight:      SpO2: 100% 99% 100% 100%    BP 137/81  Pulse 79  Temp(Src) 99.3 F (37.4 C) (Oral)  Resp 18  Ht  6' (1.829 m)  Wt 84.369 kg (186 lb)  BMI 25.22 kg/m2  SpO2 100%  General appearance: alert, cooperative, appears stated age and no distress Head: Normocephalic, without obvious abnormality, atraumatic, swollen face. Swelling and erythema along left face with tenderness. No fluctuation. Eyes: conjunctivae/corneas clear. PERRL, EOM's intact. Fundi benign. Throat: due to swelling it was difficult to examine the throat. But uvula was seen. No masses noted. Neck: some swelling and warmth noted along left side.  No fluctuation. Resp: clear to auscultation bilaterally Cardio: regular rate and rhythm, S1, S2 normal, no murmur, click, rub or gallop GI: soft, non-tender; bowel sounds normal; no masses,  no organomegaly Extremities: extremities normal, atraumatic, no cyanosis or edema Pulses: 2+ and symmetric Skin: Skin color, texture, turgor normal. No rashes or lesions Lymph nodes: enlarged Cervical LN.  Neurologic: No focal deficits  Laboratory Data: Results for orders placed during the hospital encounter of 06/19/14 (from the past 48 hour(s))  I-STAT CHEM 8, ED     Status: None   Collection Time    06/19/14 10:53 AM      Result Value Ref Range   Sodium 137  137 - 147 mEq/L   Potassium 4.1  3.7 - 5.3 mEq/L   Chloride 98  96 - 112 mEq/L   BUN 16  6 - 23 mg/dL   Creatinine, Ser 1.61  0.50 - 1.35 mg/dL   Glucose, Bld 94  70 - 99 mg/dL   Calcium, Ion 0.96  0.45 - 1.23 mmol/L   TCO2 27  0 - 100 mmol/L   Hemoglobin 16.3  13.0 - 17.0 g/dL   HCT 40.9  81.1 - 91.4 %  CBC WITH DIFFERENTIAL     Status: Abnormal   Collection Time    06/19/14 11:15 AM      Result Value Ref Range   WBC 9.1  4.0 - 10.5 K/uL   RBC 4.72  4.22 - 5.81 MIL/uL   Hemoglobin 15.2  13.0 - 17.0 g/dL   HCT 78.2  95.6 - 21.3 %   MCV 88.6  78.0 - 100.0 fL   MCH 32.2  26.0 - 34.0 pg   MCHC 36.4 (*) 30.0 - 36.0 g/dL   RDW 08.6  57.8 - 46.9 %   Platelets 127 (*) 150 - 400 K/uL   Neutrophils Relative % 73  43 - 77 %   Neutro Abs 6.6  1.7 - 7.7 K/uL   Lymphocytes Relative 15  12 - 46 %   Lymphs Abs 1.4  0.7 - 4.0 K/uL   Monocytes Relative 12  3 - 12 %   Monocytes Absolute 1.1 (*) 0.1 - 1.0 K/uL   Eosinophils Relative 0  0 - 5 %   Eosinophils Absolute 0.0  0.0 - 0.7 K/uL   Basophils Relative 0  0 - 1 %   Basophils Absolute 0.0  0.0 - 0.1 K/uL    Radiology Reports: Ct Soft Tissue Neck W Contrast  06/19/2014   CLINICAL DATA:  Left lower dental abscess, trismus, oral floor swelling and submandibular swelling.  EXAM: CT NECK  WITH CONTRAST  TECHNIQUE: Multidetector CT imaging of the neck was performed using the standard protocol following the bolus administration of intravenous contrast.  CONTRAST:  75mL OMNIPAQUE IOHEXOL 300 MG/ML  SOLN  COMPARISON:  None.  FINDINGS: The visualized portions of the brain and orbits are unremarkable. Visualized paranasal sinuses are clear. Right mastoid air cells are clear. There is evidence of prior left canal wall down  mastoidectomy with soft tissue in the mastoidectomy bowl and opacification of a few small residual mastoid air cells inferiorly.  The nasopharynx is unremarkable. There is mild asymmetric soft tissue fullness of the left tonsillar pillar without a mass seen. Left mandibular second molar caries are noted with mild surrounding lucency in the alveolar process of the mandible and tiny, 7 x 3 mm medial subperiosteal gas and fluid collection (series 207, image 52 and series 203, image 32). Inflammatory changes extend into the left floor of mouth and left submandibular space without drainable fluid collection seen. There is also left parapharyngeal edema.  Enlarged left submandibular lymph nodes measure up to 1.2 cm in short axis, likely reactive. Submental lymph nodes measure up to 8 mm in short axis. Left level II lymph nodes measure up to 1.1 cm. Subcentimeter, mildly asymmetric left level III and IV lymph nodes are also noted as well as mildly asymmetric left supraclavicular lymph nodes.  Thyroid, right submandibular gland, and both parotid glands are unremarkable. Mildly low attenuation of the left submandibular gland may reflect mild edema related to adjacent infectious process. Major vascular structures of the neck appear patent. Visualized lung apices are clear. Cervical spine is unremarkable.  IMPRESSION: Inflammation in the left submandibular space and floor of mouth consistent with infection of dental origin arising from the left mandibular second molar. There is a 7 x 3 mm  subperiosteal abscess along the lingual surface of the mandible.   Electronically Signed   By: Sebastian Ache   On: 06/19/2014 12:50    Problem List  Principal Problem:   Dental abscess Active Problems:   Smoker   Oral inflammation   Assessment: This is a 33 year old, African American male, who presents with facial swelling, painful swallowing and appears to have a dental abscess with inflammation involving the left submandibular space and floor of mouth. He is maintaining his airway at this time.  Plan: #1 dental abscess with the inflammation in the oral cavity and submandibular space: There is a small subperiosteal abscess in the lingual surface of the mandible. No abscess in the retropharyngeal space. The case has been discussed with Dr. Ocie Doyne with oral surgery. Recommendation is to give Unasyn. No acute indication for surgery. If the patient improves, he can followup with Dr. Randa Evens office. He'll be kept on clear liquids. He'll be given anti-inflammatory agents.  #2 tobacco abuse: He has been counseled. He hasn't smoked in the last few days. Nicotine patch if needed.  #3 alcohol use: He drinks regularly, but not excessively. Watch for signs and symptoms of withdrawal, although risk for the same is low. His last consumption was on Monday.  DVT Prophylaxis: Heparin Code Status: Full code Family Communication: Discussed with the patient and his wife  Disposition Plan: Admit to MedSurg   Further management decisions will depend on results of further testing and patient's response to treatment.   [facility]  [facility] Pager [phone]  If 7PM-7AM, please contact night-coverage [email or web address] Password [email or web address]  06/19/2014, 2:21 PM  Disclaimer: This note was dictated with voice recognition software. Similar sounding words can inadvertently be transcribed and may not be corrected upon review.

## 2014-06-19 NOTE — ED Notes (Signed)
Pt in ct 

## 2014-06-20 LAB — COMPREHENSIVE METABOLIC PANEL
ALBUMIN: 2.9 g/dL — AB (ref 3.5–5.2)
ALT: 14 U/L (ref 0–53)
AST: 17 U/L (ref 0–37)
Alkaline Phosphatase: 55 U/L (ref 39–117)
Anion gap: 12 (ref 5–15)
BUN: 11 mg/dL (ref 6–23)
CALCIUM: 8.3 mg/dL — AB (ref 8.4–10.5)
CO2: 27 meq/L (ref 19–32)
Chloride: 101 mEq/L (ref 96–112)
Creatinine, Ser: 0.93 mg/dL (ref 0.50–1.35)
GFR calc Af Amer: >90 mL/min (ref >90)
GFR calc non Af Amer: >90 mL/min (ref >90)
Glucose, Bld: 105 mg/dL — ABNORMAL HIGH (ref 70–99)
Potassium: 3.7 mEq/L (ref 3.7–5.3)
SODIUM: 140 meq/L (ref 137–147)
TOTAL PROTEIN: 6.2 g/dL (ref 6.0–8.3)
Total Bilirubin: 0.6 mg/dL (ref 0.3–1.2)

## 2014-06-20 LAB — CBC
HCT: 36.8 % — ABNORMAL LOW (ref 39.0–52.0)
HEMOGLOBIN: 13.1 g/dL (ref 13.0–17.0)
MCH: 31.2 pg (ref 26.0–34.0)
MCHC: 35.6 g/dL (ref 30.0–36.0)
MCV: 87.6 fL (ref 78.0–100.0)
Platelets: 147 10*3/uL — ABNORMAL LOW (ref 150–400)
RBC: 4.2 MIL/uL — AB (ref 4.22–5.81)
RDW: 12.1 % (ref 11.5–15.5)
WBC: 6.3 10*3/uL (ref 4.0–10.5)

## 2014-06-20 NOTE — ED Provider Notes (Signed)
Medical screening examination/treatment/procedure(s) were performed by non-physician practitioner and as supervising physician I was immediately available for consultation/collaboration.  Megan E Docherty, MD 06/20/14 0716 

## 2014-06-20 NOTE — Progress Notes (Signed)
[facility] Progress Note   Nicholas Lee UJW:[id number] DOB: 07/17/1981 DOA: 06/19/2014 PCP: Leo Grosser, MD  Brief narrative: Nicholas Lee is a 33 y.o. male  presents with a left subperiosteal abscess of the mandible originating in left mandibular second molar. He did not improve with oral  Amoxil prescribed on Thursday at an urgent care. He has a Education officer, community and will f/u this coming Thursday. The case was discussed with Dr. Ocie Doyne with oral surgery. Recommendation is to give Unasyn.    Subjective: Swelling improving- tolerated clears.   Assessment/Plan: Principal Problem:   Dental abscess - con Unasyn- plan to d/c on Augmentin tomorrow - advance to full liquids  Active Problems:   Smoker - smoking cessation advised    Code Status: Full code Family Communication: sig other at bedside Disposition Plan: home tomorrow  Consultants: none  Procedures: none  Antibiotics: Anti-infectives   Start     Dose/Rate Route Frequency Ordered Stop   06/19/14 1600  Ampicillin-Sulbactam (UNASYN) 3 g in sodium chloride 0.9 % 100 mL IVPB     3 g 100 mL/hr over 60 Minutes Intravenous Every 6 hours 06/19/14 1459     06/19/14 1045  clindamycin (CLEOCIN) IVPB 600 mg    Comments:  Dental abscess   600 mg 100 mL/hr over 30 Minutes Intravenous  Once 06/19/14 1036 06/19/14 1213       DVT prophylaxis: Heparin  Objective: Filed Weights   06/19/14 1012  Weight: 84.369 kg (186 lb)    Intake/Output Summary (Last 24 hours) at 06/20/14 1040 Last data filed at 06/20/14 0600  Gross per 24 hour  Intake 2262.5 ml  Output    900 ml  Net 1362.5 ml     Vitals Filed Vitals:   06/19/14 1430 06/19/14 1456 06/19/14 2141 06/20/14 0521  BP: 135/77 146/89 125/74 109/66  Pulse: 84 78 77 79  Temp:  100.5 F (38.1 C) 98 F (36.7 C) 98.4 F (36.9 C)  TempSrc:  Oral Oral Oral  Resp: 14  16 17   Height:      Weight:      SpO2: 99% 98% 97% 99%    Exam: General: No acute  respiratory distress HEENT: swelling and tenderness in left mandible and neck Lungs: Clear to auscultation bilaterally without wheezes or crackles Cardiovascular: Regular rate and rhythm without murmur gallop or rub normal S1 and S2 Abdomen: Nontender, nondistended, soft, bowel sounds positive, no rebound, no ascites, no appreciable mass Extremities: No significant cyanosis, clubbing, or edema bilateral lower extremities  Data Reviewed: Basic Metabolic Panel:  Recent Labs Lab 06/19/14 1053 06/20/14 0255  NA 137 140  K 4.1 3.7  CL 98 101  CO2  --  27  GLUCOSE 94 105*  BUN 16 11  CREATININE 1.00 0.93  CALCIUM  --  8.3*   Liver Function Tests:  Recent Labs Lab 06/20/14 0255  AST 17  ALT 14  ALKPHOS 55  BILITOT 0.6  PROT 6.2  ALBUMIN 2.9*   No results found for this basename: LIPASE, AMYLASE,  in the last 168 hours No results found for this basename: AMMONIA,  in the last 168 hours CBC:  Recent Labs Lab 06/19/14 1053 06/19/14 1115 06/20/14 0255  WBC  --  9.1 6.3  NEUTROABS  --  6.6  --   HGB 16.3 15.2 13.1  HCT 48.0 41.8 36.8*  MCV  --  88.6 87.6  PLT  --  127* 147*   Cardiac Enzymes: No results found  for this basename: CKTOTAL, CKMB, CKMBINDEX, TROPONINI,  in the last 168 hours BNP (last 3 results) No results found for this basename: PROBNP,  in the last 8760 hours CBG: No results found for this basename: GLUCAP,  in the last 168 hours  No results found for this or any previous visit (from the past 240 hour(s)).   Studies:  Recent x-ray studies have been reviewed in detail by the Attending Physician  Scheduled Meds:  Scheduled Meds: . sodium chloride   Intravenous STAT  . ampicillin-sulbactam (UNASYN) IV  3 g Intravenous Q6H  . docusate sodium  100 mg Oral BID  . heparin  5,000 Units Subcutaneous 3 times per day  . ibuprofen  400 mg Oral TID   Continuous Infusions: . sodium chloride 75 mL/hr at 06/20/14 0847    Time spent on care of this  patient: 30 min   Jorge Amparo, MD 06/20/2014, 10:40 AM  LOS: 1 day   [facility] Office  [phone] Pager - Text Page per [email or web address]  If 7PM-7AM, please contact night-coverage [email or web address]

## 2014-06-20 NOTE — Progress Notes (Signed)
Patient refused his heparin both scheduled doses on my shift.

## 2014-06-21 MED ORDER — AMOXICILLIN-POT CLAVULANATE 875-125 MG PO TABS: 1.0000 | ORAL_TABLET | Freq: Two times a day (BID) | ORAL | Status: DC

## 2014-06-21 MED ORDER — IBUPROFEN 400 MG PO TABS: 400.0000 mg | ORAL_TABLET | Freq: Three times a day (TID) | ORAL | Status: DC

## 2014-06-21 MED ORDER — AMOXICILLIN-POT CLAVULANATE 875-125 MG PO TABS
1.0000 | ORAL_TABLET | Freq: Two times a day (BID) | ORAL | Status: DC
  Administered 2014-06-21: 1 via ORAL
  Filled 2014-06-21: qty 1

## 2014-06-21 MED ORDER — DSS 100 MG PO CAPS: 100.0000 mg | ORAL_CAPSULE | Freq: Two times a day (BID) | ORAL | Status: DC

## 2014-06-21 MED ORDER — HYDROCODONE-ACETAMINOPHEN 5-325 MG PO TABS: 1.0000 | ORAL_TABLET | ORAL | Status: DC | PRN

## 2014-06-21 NOTE — Discharge Instructions (Signed)
Continue the Motrin along with other medications- it will decrease the swelling in your face.  See your dentist ASAP    Abscessed Tooth An abscessed tooth is an infection around your tooth. It may be caused by holes or damage to the tooth (cavity) or a dental disease. An abscessed tooth causes mild to very bad pain in and around the tooth. See your dentist right away if you have tooth or gum pain. HOME CARE  Take your medicine as told. Finish it even if you start to feel better.  Do not drive after taking pain medicine.  Rinse your mouth (gargle) often with salt water ( teaspoon salt in 8 ounces of warm water).  Do not apply heat to the outside of your face. GET HELP RIGHT AWAY IF:   You have a temperature by mouth above 102 F (38.9 C), not controlled by medicine.  You have chills and a very bad headache.  You have problems breathing or swallowing.  Your mouth will not open.  You develop puffiness (swelling) on the neck or around the eye.  Your pain is not helped by medicine.  Your pain is getting worse instead of better. MAKE SURE YOU:   Understand these instructions.  Will watch your condition.  Will get help right away if you are not doing well or get worse. Document Released: 04/23/2008 Document Revised: 01/28/2012 Document Reviewed: 02/13/2011 [facility] Patient Information 2015 [employer] [place]. This information is not intended to replace advice given to you by your health care provider. Make sure you discuss any questions you have with your health care provider.

## 2014-06-21 NOTE — Progress Notes (Signed)
AVS discharge instructions were given and went over with patient. Patient was given prescriptions for augmentin, vicodin, and ibuprofen to take to his pharmacy. Patient was also given a doctor's note from Dr. Butler Denmarkizwan. Patient ststed that he did not have any questions. Now patient is getting IV antibiotic unasyn before he goes home.

## 2014-07-07 ENCOUNTER — Emergency Department (HOSPITAL_COMMUNITY): Payer: 59

## 2014-07-07 ENCOUNTER — Emergency Department (HOSPITAL_COMMUNITY)
Admission: EM | Admit: 2014-07-07 | Discharge: 2014-07-07 | Disposition: A | Discharge status: Home / Self Care | Payer: 59 | Attending: Emergency Medicine | Admitting: Emergency Medicine

## 2014-07-07 ENCOUNTER — Encounter (HOSPITAL_COMMUNITY): Payer: Self-pay | Admitting: Emergency Medicine

## 2014-07-07 DIAGNOSIS — R05 Cough: Secondary | ICD-10-CM | POA: Diagnosis not present

## 2014-07-07 DIAGNOSIS — Z87891 Personal history of nicotine dependence: Secondary | ICD-10-CM | POA: Insufficient documentation

## 2014-07-07 DIAGNOSIS — Z791 Long term (current) use of non-steroidal anti-inflammatories (NSAID): Secondary | ICD-10-CM | POA: Diagnosis not present

## 2014-07-07 DIAGNOSIS — Z792 Long term (current) use of antibiotics: Secondary | ICD-10-CM | POA: Insufficient documentation

## 2014-07-07 DIAGNOSIS — Z79899 Other long term (current) drug therapy: Secondary | ICD-10-CM | POA: Diagnosis not present

## 2014-07-07 DIAGNOSIS — R059 Cough, unspecified: Secondary | ICD-10-CM | POA: Insufficient documentation

## 2014-07-07 DIAGNOSIS — F411 Generalized anxiety disorder: Secondary | ICD-10-CM | POA: Diagnosis not present

## 2014-07-07 DIAGNOSIS — M436 Torticollis: Secondary | ICD-10-CM | POA: Diagnosis not present

## 2014-07-07 DIAGNOSIS — J029 Acute pharyngitis, unspecified: Secondary | ICD-10-CM | POA: Insufficient documentation

## 2014-07-07 DIAGNOSIS — R29898 Other symptoms and signs involving the musculoskeletal system: Secondary | ICD-10-CM

## 2014-07-07 LAB — CBC WITH DIFFERENTIAL/PLATELET
BASOS PCT: 1 % (ref 0–1)
Basophils Absolute: 0 10*3/uL (ref 0.0–0.1)
Eosinophils Absolute: 0.2 10*3/uL (ref 0.0–0.7)
Eosinophils Relative: 2 % (ref 0–5)
HCT: 38.5 % — ABNORMAL LOW (ref 39.0–52.0)
HEMOGLOBIN: 14.2 g/dL (ref 13.0–17.0)
Lymphocytes Relative: 41 % (ref 12–46)
Lymphs Abs: 2.7 10*3/uL (ref 0.7–4.0)
MCH: 31.4 pg (ref 26.0–34.0)
MCHC: 36.9 g/dL — AB (ref 30.0–36.0)
MCV: 85.2 fL (ref 78.0–100.0)
Monocytes Absolute: 0.5 10*3/uL (ref 0.1–1.0)
Monocytes Relative: 7 % (ref 3–12)
NEUTROS ABS: 3.2 10*3/uL (ref 1.7–7.7)
NEUTROS PCT: 49 % (ref 43–77)
Platelets: 216 10*3/uL (ref 150–400)
RBC: 4.52 MIL/uL (ref 4.22–5.81)
RDW: 11.9 % (ref 11.5–15.5)
WBC: 6.5 10*3/uL (ref 4.0–10.5)

## 2014-07-07 LAB — BASIC METABOLIC PANEL
ANION GAP: 12 (ref 5–15)
BUN: 10 mg/dL (ref 6–23)
CO2: 27 mEq/L (ref 19–32)
Calcium: 9.3 mg/dL (ref 8.4–10.5)
Chloride: 103 mEq/L (ref 96–112)
Creatinine, Ser: 0.88 mg/dL (ref 0.50–1.35)
GFR calc non Af Amer: >90 mL/min (ref >90)
Glucose, Bld: 90 mg/dL (ref 70–99)
POTASSIUM: 3.9 meq/L (ref 3.7–5.3)
Sodium: 142 mEq/L (ref 137–147)

## 2014-07-07 MED ORDER — IOHEXOL 300 MG/ML  SOLN
75.0000 mL | Freq: Once | INTRAMUSCULAR | Status: AC | PRN
  Administered 2014-07-07: 75 mL via INTRAVENOUS

## 2014-07-07 NOTE — ED Provider Notes (Signed)
CSN: [id number]     Arrival date & time 07/07/14  2440 History   First MD Initiated Contact with Patient 07/07/14 [phone]     Chief Complaint  Patient presents with  . Sore Throat     (Consider location/radiation/quality/duration/timing/severity/associated sxs/prior Treatment) HPI Comments: Patient with recent dental infection treated with IV antibiotics and dental extraction presents with continued sensation of tightness in his neck for the past one week. He has not had any difficulties with breathing or swallowing. He has not had fever or neck pain. No pain with movement of his neck. No chest pain. Patient stopped smoking and has had occasional cough and trouble producing mucus. No treatments prior to arrival. Onset of symptoms gradual course is constant. Nothing makes symptoms better or worse.  The history is provided by the patient and medical records.    Past Medical History  Diagnosis Date  . Smoker    Past Surgical History  Procedure Laterality Date  . Tympanostomy tube placement      left ear in 90's   Family History  Problem Relation Age of Onset  . Heart disease Maternal Grandfather     53 y/o  . Heart disease Mother    History  Substance Use Topics  . Smoking status: Former Smoker -- 0.25 packs/day    Types: Cigarettes  . Smokeless tobacco: Not on file     Comment: 1 pack per week  . Alcohol Use: Yes     Comment: 3-4 days per week    Review of Systems  Constitutional: Negative for fever.  HENT: Negative for facial swelling, rhinorrhea, sore throat and trouble swallowing.   Eyes: Negative for redness.  Respiratory: Positive for cough.   Cardiovascular: Negative for chest pain.  Gastrointestinal: Negative for nausea, vomiting, abdominal pain and diarrhea.  Genitourinary: Negative for dysuria.  Musculoskeletal: Positive for neck stiffness (neck tightness, no pain with movement). Negative for myalgias.  Skin: Negative for rash.  Neurological: Negative for  headaches.      Allergies  Review of patient's allergies indicates no known allergies.  Home Medications   Prior to Admission medications   Medication Sig Start Date End Date Taking? Authorizing Provider  amoxicillin-clavulanate (AUGMENTIN) 875-125 MG per tablet Take 1 tablet by mouth 2 (two) times daily. 06/21/14   Calvert Cantor, MD  docusate sodium 100 MG CAPS Take 100 mg by mouth 2 (two) times daily. 06/21/14   Calvert Cantor, MD  GINSENG PO Take 1 Bottle by mouth once a week.    Historical Provider, MD  HYDROcodone-acetaminophen (NORCO/VICODIN) 5-325 MG per tablet Take 1 tablet by mouth every 4 (four) hours as needed. 06/21/14   Calvert Cantor, MD  ibuprofen (ADVIL,MOTRIN) 400 MG tablet Take 1 tablet (400 mg total) by mouth 3 (three) times daily. 06/21/14   Calvert Cantor, MD  Multiple Vitamin (MULTIVITAMIN WITH MINERALS) TABS tablet Take 1 tablet by mouth daily.    Historical Provider, MD   BP 125/84  Pulse 70  Temp(Src) 98.7 F (37.1 C) (Oral)  Resp 20  SpO2 99%  Physical Exam  Nursing note and vitals reviewed. Constitutional: He appears well-developed and well-nourished.  HENT:  Head: Normocephalic and atraumatic.  Right Ear: External ear normal.  Left Ear: External ear normal.  Nose: Nose normal.  Mouth/Throat: Oropharynx is clear and moist.  Multiple dental extractions. No dental abscess.   Eyes: Conjunctivae are normal. Pupils are equal, round, and reactive to light. Right eye exhibits no discharge. Left eye exhibits no discharge.  Neck: Normal range of motion. Neck supple.  Full ROM in all 6 directions without pain  Cardiovascular: Normal rate, regular rhythm and normal heart sounds.   Pulmonary/Chest: Effort normal and breath sounds normal. No respiratory distress. He has no wheezes. He has no rales.  Abdominal: Soft. There is no tenderness.  Lymphadenopathy:    He has no cervical adenopathy.  Neurological: He is alert.  Skin: Skin is warm and dry.  Psychiatric: His mood  appears anxious.    ED Course  Procedures (including critical care time) Labs Review Labs Reviewed  CBC WITH DIFFERENTIAL - Abnormal; Notable for the following:    HCT 38.5 (*)    MCHC 36.9 (*)    All other components within normal limits  BASIC METABOLIC PANEL    Imaging Review Ct Soft Tissue Neck W Contrast  07/07/2014   CLINICAL DATA:  Continued throat pressure after recent dental abscess. Interval resolution of facial swelling.  EXAM: CT NECK WITH CONTRAST  TECHNIQUE: Multidetector CT imaging of the neck was performed using the standard protocol following the bolus administration of intravenous contrast.  CONTRAST:  75mL OMNIPAQUE IOHEXOL 300 MG/ML  SOLN  COMPARISON:  06/19/2014  FINDINGS: The visualized portion of the brain is unremarkable. Orbits are unremarkable. Mild mucosal thickening is noted in the right sphenoid sinus and left ethmoid air cells. Sequelae of prior left canal wall down mastoidectomy are again identified. Opacification of residual inferior left-sided mastoid air cells is unchanged.  The nasopharynx is unremarkable. Mild asymmetric soft tissue fullness of the left palatine tonsillar region is similar to the prior study without a discrete mass seen. There has been interval left second and third mandibular molar extraction with prominent lucency and small amount of gas in the mandible at the extraction sites. The tiny subperiosteal gas and fluid collection along the medial aspect of the left mandible on the prior study is no longer seen. No new fluid collection is identified. Inflammatory changes in the left floor of mouth and submandibular space have largely resolved.  The right mandibular second molar has also been extracted in the interim. Additionally, the right maxillary second bicuspid has also been extracted, and there is a very small amount of slightly asymmetric edema/fluid extending along the adjacent lingual surface of the maxilla measuring up to 4 mm in thickness  (series 2, image 44). No drainable fluid collection is identified.  The submandibular, parotid, and thyroid glands are normal in appearance. Level I and II lymph nodes on the prior study have decreased in size and are now subcentimeter. Major vascular structures of the neck are patent. Visualized lung apices are clear.  IMPRESSION: Postoperative changes from extraction of multiple teeth as above, with prominent defect in the left mandible at the site of second and third molar extraction. Tiny subperiosteal collection on the prior study has resolved, and surrounding inflammatory changes have also largely resolved.   Electronically Signed   By: Sebastian Ache   On: 07/07/2014 09:12     EKG Interpretation None      7:34 AM Patient seen and examined. Work-up initiated. Discussed with Dr. Rosalia Hammers. Patient appears well.   Vital signs reviewed and are as follows: BP 125/84  Pulse 70  Temp(Src) 98.7 F (37.1 C) (Oral)  Resp 20  SpO2 99%  10:01 AM CT reviewed. Patient informed of results. No further intervention at bedtime. Patient has followup arranged with his oral surgeon and he is encouraged to keep this plan.  Patient counseled to return with  worsening symptoms, trouble breathing or swallowing, shortness of breath, or any other concerns. He verbalizes understanding and agrees with plan.  MDM   Final diagnoses:  Neck tightness   Patient with a sensation of tightness and neck. Recent severe dental abscess and infection with subsequent extractions. Interval improvement on CT scan without new signs of infection or other abnormality. No concern for airway compromise. Patient to followup as planned.    Renne CriglerJoshua Claudia Alvizo, PA-C 07/07/14 1002

## 2014-07-07 NOTE — ED Notes (Signed)
PT ambulated with baseline gait; VSS; A&Ox3; no signs of distress; respirations even and unlabored; skin warm and dry; no questions upon discharge.  

## 2014-07-07 NOTE — Discharge Instructions (Signed)
Please read and follow all provided instructions.  Your diagnoses today include:  1. Neck tightness     Tests performed today include:  Blood counts and electrolytes  CT of neck -- shows improvement in your previous infection and no new problems  Vital signs. See below for your results today.   Medications prescribed:   None   Take any prescribed medications only as directed.  Home care instructions:  Follow any educational materials contained in this packet.  Follow-up instructions: Please follow-up with your primary care provider in the next 3 days for further evaluation of your symptoms.   Return instructions:   Please return to the Emergency Department if you experience worsening symptoms.   Return with worsening pain, trouble breathing or swallowing, fever  Please return if you have any other emergent concerns.  Additional Information:  Your vital signs today were: BP 128/70   Pulse 78   Temp(Src) 97.8 F (36.6 C) (Oral)   Resp 22   SpO2 97% If your blood pressure (BP) was elevated above 135/85 this visit, please have this repeated by your doctor within one month. --------------

## 2014-07-07 NOTE — ED Notes (Signed)
Patient transported to CT without distress 

## 2014-07-07 NOTE — ED Notes (Signed)
PA at bedside.

## 2014-07-07 NOTE — ED Notes (Addendum)
Being treated for ludwig's angina. Taking amoxicillin. Had facial swelling (lt. Side) and now has resolved. Pt. Is now just having pressure in the throat. Hard to bring up phlegm.  Denies cp. No sob. Pt. Did just quit smoking. The swelling in face has resolved.

## 2014-07-12 NOTE — ED Provider Notes (Signed)
History/physical exam/procedure(s) were performed by non-physician practitioner and as supervising physician I was immediately available for consultation/collaboration. I have reviewed all notes and am in agreement with care and plan.   Teagan Heidrick S Braylin Xu, MD 07/12/14 2327 

## 2014-08-10 NOTE — Discharge Summary (Signed)
Physician Discharge Summary  Nicholas Lee NWG:[id number] DOB: 1980-12-03 DOA: 06/19/2014  PCP: Leo Grosser, MD  Admit date: 06/19/2014 Discharge date: 06/21/2014  Time spent:45 minutes   Discharge Condition: stable Diet recommendation: as tolerated  Discharge Diagnoses:  Principal Problem:   Dental abscess Active Problems:   Smoker   Oral inflammation   History of present illness:  Nicholas Lee is a 33 y.o. male presents with a left subperiosteal abscess of the mandible originating in left mandibular second molar. He did not improve with oral Amoxil prescribed on Thursday at an urgent care. He has a Education officer, community and will f/u this coming Thursday. The case was discussed with Dr. Ocie Doyne with oral surgery. Recommendation is to give Unasyn.  Hospital Course:  Principal Problem:  Dental abscess  - given Unasyn in house with improvement- eating soft diet -will d/c on Augmentin today  - f/u with dentist later this week.  Active Problems:  Smoker  - smoking cessation advised   Discharge Exam: Filed Weights   06/19/14 1012  Weight: 84.369 kg (186 lb)   Filed Vitals:   06/21/14 0520  BP: 113/69  Pulse: 89  Temp: 98.5 F (36.9 C)  Resp: 16    General: AAO x 3, no distress Cardiovascular: RRR, no murmurs  Respiratory: clear to auscultation bilaterally GI: soft, non-tender, non-distended, bowel sound positive  Discharge Instructions You were cared for by a hospitalist during your hospital stay. If you have any questions about your discharge medications or the care you received while you were in the hospital after you are discharged, you can call the unit and asked to speak with the hospitalist on call if the hospitalist that took care of you is not available. Once you are discharged, your primary care physician will handle any further medical issues. Please note that NO REFILLS for any discharge medications will be authorized once you are discharged, as it is imperative  that you return to your primary care physician (or establish a relationship with a primary care physician if you do not have one) for your aftercare needs so that they can reassess your need for medications and monitor your lab values.  Discharge Instructions   Diet - low sodium heart healthy    Complete by:  As directed   Diet as tolerated- for now, soft diet, easy to chew and swallow     Increase activity slowly    Complete by:  As directed             Medication List    STOP taking these medications       amoxicillin 500 MG capsule  Commonly known as:  AMOXIL      TAKE these medications       amoxicillin-clavulanate 875-125 MG per tablet  Commonly known as:  AUGMENTIN  Take 1 tablet by mouth 2 (two) times daily.     DSS 100 MG Caps  Take 100 mg by mouth 2 (two) times daily.     GINSENG PO  Take 1 Bottle by mouth once a week.     HYDROcodone-acetaminophen 5-325 MG per tablet  Commonly known as:  NORCO/VICODIN  Take 1 tablet by mouth every 4 (four) hours as needed.     ibuprofen 400 MG tablet  Commonly known as:  ADVIL,MOTRIN  Take 1 tablet (400 mg total) by mouth 3 (three) times daily.     multivitamin with minerals Tabs tablet  Take 1 tablet by mouth daily.  No Known Allergies    The results of significant diagnostics from this hospitalization (including imaging, microbiology, ancillary and laboratory) are listed below for reference.    Significant Diagnostic Studies: No results found.  Microbiology: No results found for this or any previous visit (from the past 240 hour(s)).   Labs: Basic Metabolic Panel: No results found for this basename: NA, K, CL, CO2, GLUCOSE, BUN, CREATININE, CALCIUM, MG, PHOS,  in the last 168 hours Liver Function Tests: No results found for this basename: AST, ALT, ALKPHOS, BILITOT, PROT, ALBUMIN,  in the last 168 hours No results found for this basename: LIPASE, AMYLASE,  in the last 168 hours No results found for this  basename: AMMONIA,  in the last 168 hours CBC: No results found for this basename: WBC, NEUTROABS, HGB, HCT, MCV, PLT,  in the last 168 hours Cardiac Enzymes: No results found for this basename: CKTOTAL, CKMB, CKMBINDEX, TROPONINI,  in the last 168 hours BNP: BNP (last 3 results) No results found for this basename: PROBNP,  in the last 8760 hours CBG: No results found for this basename: GLUCAP,  in the last 168 hours     Signed:  Calvert Cantor, MD [facility] 06/21/2014, 1:36 PM

## 2014-11-29 ENCOUNTER — Other Ambulatory Visit: Payer: 59

## 2014-11-29 DIAGNOSIS — F17200 Nicotine dependence, unspecified, uncomplicated: Secondary | ICD-10-CM

## 2014-11-29 DIAGNOSIS — Z0000 Encounter for general adult medical examination without abnormal findings: Secondary | ICD-10-CM

## 2014-11-29 LAB — COMPLETE METABOLIC PANEL WITH GFR
ALT: 28 U/L (ref 0–53)
AST: 30 U/L (ref 0–37)
Albumin: 4.3 g/dL (ref 3.5–5.2)
Alkaline Phosphatase: 61 U/L (ref 39–117)
BUN: 15 mg/dL (ref 6–23)
CALCIUM: 9.1 mg/dL (ref 8.4–10.5)
CO2: 25 meq/L (ref 19–32)
Chloride: 102 mEq/L (ref 96–112)
Creat: 1 mg/dL (ref 0.50–1.35)
GLUCOSE: 82 mg/dL (ref 70–99)
POTASSIUM: 4.7 meq/L (ref 3.5–5.3)
Sodium: 140 mEq/L (ref 135–145)
Total Bilirubin: 0.6 mg/dL (ref 0.2–1.2)
Total Protein: 7.1 g/dL (ref 6.0–8.3)

## 2014-11-29 LAB — CBC WITH DIFFERENTIAL/PLATELET
BASOS PCT: 0 % (ref 0–1)
Basophils Absolute: 0 10*3/uL (ref 0.0–0.1)
Eosinophils Absolute: 0.2 10*3/uL (ref 0.0–0.7)
Eosinophils Relative: 2 % (ref 0–5)
HCT: 43.1 % (ref 39.0–52.0)
Hemoglobin: 15.1 g/dL (ref 13.0–17.0)
Lymphocytes Relative: 29 % (ref 12–46)
Lymphs Abs: 2.8 10*3/uL (ref 0.7–4.0)
MCH: 30.8 pg (ref 26.0–34.0)
MCHC: 35 g/dL (ref 30.0–36.0)
MCV: 88 fL (ref 78.0–100.0)
MPV: 9.8 fL (ref 8.6–12.4)
Monocytes Absolute: 0.8 10*3/uL (ref 0.1–1.0)
Monocytes Relative: 8 % (ref 3–12)
Neutro Abs: 5.8 10*3/uL (ref 1.7–7.7)
Neutrophils Relative %: 61 % (ref 43–77)
Platelets: 166 10*3/uL (ref 150–400)
RBC: 4.9 MIL/uL (ref 4.22–5.81)
RDW: 13.5 % (ref 11.5–15.5)
WBC: 9.5 10*3/uL (ref 4.0–10.5)

## 2014-11-29 LAB — LIPID PANEL
Cholesterol: 138 mg/dL (ref 0–200)
HDL: 59 mg/dL (ref >39)
LDL Cholesterol: 60 mg/dL (ref 0–99)
TRIGLYCERIDES: 95 mg/dL (ref <150)
Total CHOL/HDL Ratio: 2.3 Ratio
VLDL: 19 mg/dL (ref 0–40)

## 2014-11-30 ENCOUNTER — Encounter: Payer: Self-pay | Admitting: Family Medicine

## 2014-11-30 NOTE — Progress Notes (Signed)
   Subjective:    Patient ID: Nicholas Lee, male    DOB: 10/18/1981, 34 y.o.   MRN: [id number]  HPI   Review of Systems     Objective:   Physical Exam        Assessment & Plan:   This encounter was created in error - please disregard.

## 2014-12-07 ENCOUNTER — Telehealth: Payer: Self-pay | Admitting: Family Medicine

## 2014-12-07 NOTE — Telephone Encounter (Signed)
Patient calling to get lab results  [phone]

## 2014-12-07 NOTE — Telephone Encounter (Signed)
Had appt after labs that he missed. Rescheduled appt.

## 2014-12-10 ENCOUNTER — Ambulatory Visit: Payer: Self-pay | Admitting: Family Medicine

## 2014-12-14 ENCOUNTER — Encounter: Payer: Self-pay | Admitting: Family Medicine

## 2014-12-14 ENCOUNTER — Ambulatory Visit (INDEPENDENT_AMBULATORY_CARE_PROVIDER_SITE_OTHER): Payer: 59 | Admitting: Family Medicine

## 2014-12-14 VITALS — BP 126/84 | HR 80 | Temp 98.0°F | Resp 14 | Ht 71.0 in | Wt 212.0 lb

## 2014-12-14 DIAGNOSIS — Z0000 Encounter for general adult medical examination without abnormal findings: Secondary | ICD-10-CM

## 2014-12-14 NOTE — Progress Notes (Signed)
Subjective:    Patient ID: Nicholas Lee, male    DOB: Apr 26, 1981, 34 y.o.   MRN: [id number]  HPI  Patient is a 34 year old African-American male who is here today for complete physical exam. He has no concerns. He does have a 1 cm sebaceous cyst on the anterior portion of his left shoulder. Otherwise he is doing well with no concerns. He declines a flu shot today. He also declined the tetanus vaccine. He is quit smoking. His most recent lab work as listed below: Lab on 11/29/2014  Component Date Value Ref Range Status  . Sodium 11/29/2014 140  135 - 145 mEq/L Final  . Potassium 11/29/2014 4.7  3.5 - 5.3 mEq/L Final  . Chloride 11/29/2014 102  96 - 112 mEq/L Final  . CO2 11/29/2014 25  19 - 32 mEq/L Final  . Glucose, Bld 11/29/2014 82  70 - 99 mg/dL Final  . BUN 11/29/2014 15  6 - 23 mg/dL Final  . Creat 11/29/2014 1.00  0.50 - 1.35 mg/dL Final  . Total Bilirubin 11/29/2014 0.6  0.2 - 1.2 mg/dL Final  . Alkaline Phosphatase 11/29/2014 61  39 - 117 U/L Final  . AST 11/29/2014 30  0 - 37 U/L Final  . ALT 11/29/2014 28  0 - 53 U/L Final  . Total Protein 11/29/2014 7.1  6.0 - 8.3 g/dL Final  . Albumin 11/29/2014 4.3  3.5 - 5.2 g/dL Final  . Calcium 11/29/2014 9.1  8.4 - 10.5 mg/dL Final  . GFR, Est African American 11/29/2014 >89   Final  . GFR, Est Non African American 11/29/2014 >89   Final   Comment:   The estimated GFR is a calculation valid for adults (>=48 years old) that uses the CKD-EPI algorithm to adjust for age and sex. It is   not to be used for children, pregnant women, hospitalized patients,    patients on dialysis, or with rapidly changing kidney function. According to the NKDEP, eGFR >89 is normal, 60-89 shows mild impairment, 30-59 shows moderate impairment, 15-29 shows severe impairment and <15 is ESRD.     Marland Kitchen Cholesterol 11/29/2014 138  0 - 200 mg/dL Final   Comment: ATP III Classification:       < 200        mg/dL        Desirable      200 - 239     mg/dL         Borderline High      >= 240        mg/dL        High     . Triglycerides 11/29/2014 95  <150 mg/dL Final  . HDL 11/29/2014 59  >39 mg/dL Final  . Total CHOL/HDL Ratio 11/29/2014 2.3   Final  . VLDL 11/29/2014 19  0 - 40 mg/dL Final  . LDL Cholesterol 11/29/2014 60  0 - 99 mg/dL Final   Comment:   Total Cholesterol/HDL Ratio:CHD Risk                        Coronary Heart Disease Risk Table                                        Men       Women          1/2 Average Risk  3.4        3.3              Average Risk              5.0        4.4           2X Average Risk              9.6        7.1           3X Average Risk             23.4       11.0 Use the calculated Patient Ratio above and the CHD Risk table  to determine the patient's CHD Risk. ATP III Classification (LDL):       < 100        mg/dL         Optimal      100 - 129     mg/dL         Near or Above Optimal      130 - 159     mg/dL         Borderline High      160 - 189     mg/dL         High       > 190        mg/dL         Very High     . WBC 11/29/2014 9.5  4.0 - 10.5 K/uL Final  . RBC 11/29/2014 4.90  4.22 - 5.81 MIL/uL Final  . Hemoglobin 11/29/2014 15.1  13.0 - 17.0 g/dL Final  . HCT 11/29/2014 43.1  39.0 - 52.0 % Final  . MCV 11/29/2014 88.0  78.0 - 100.0 fL Final  . MCH 11/29/2014 30.8  26.0 - 34.0 pg Final  . MCHC 11/29/2014 35.0  30.0 - 36.0 g/dL Final  . RDW 11/29/2014 13.5  11.5 - 15.5 % Final  . Platelets 11/29/2014 166  150 - 400 K/uL Final  . MPV 11/29/2014 9.8  8.6 - 12.4 fL Final   ** Please note change in reference range(s). **  . Neutrophils Relative % 11/29/2014 61  43 - 77 % Final  . Neutro Abs 11/29/2014 5.8  1.7 - 7.7 K/uL Final  . Lymphocytes Relative 11/29/2014 29  12 - 46 % Final  . Lymphs Abs 11/29/2014 2.8  0.7 - 4.0 K/uL Final  . Monocytes Relative 11/29/2014 8  3 - 12 % Final  . Monocytes Absolute 11/29/2014 0.8  0.1 - 1.0 K/uL Final  . Eosinophils Relative 11/29/2014 2  0 -  5 % Final  . Eosinophils Absolute 11/29/2014 0.2  0.0 - 0.7 K/uL Final  . Basophils Relative 11/29/2014 0  0 - 1 % Final  . Basophils Absolute 11/29/2014 0.0  0.0 - 0.1 K/uL Final  . Smear Review 11/29/2014 Criteria for review not met   Final   Past Medical History  Diagnosis Date  . Smoker    Past Surgical History  Procedure Laterality Date  . Tympanostomy tube placement      left ear in 90's   Current Outpatient Prescriptions on File Prior to Visit  Medication Sig Dispense Refill  . GINSENG PO Take 1 Bottle by mouth once a week.     No current facility-administered medications on file prior to visit.   No Known Allergies History   Social History  .  Marital Status: Single    Spouse Name: N/A    Number of Children: N/A  . Years of Education: N/A   Occupational History  . Not on file.   Social History Main Topics  . Smoking status: Former Smoker -- 0.25 packs/day    Types: Cigarettes  . Smokeless tobacco: Not on file     Comment: 1 pack per week  . Alcohol Use: Yes     Comment: 3-4 days per week  . Drug Use: No  . Sexual Activity: Yes     Comment: married, works at [employer] in [place]   Other Topics Concern  . Not on file   Social History Narrative   Family History  Problem Relation Age of Onset  . Heart disease Maternal Grandfather     105 y/o  . Heart disease Mother        Review of Systems  All other systems reviewed and are negative.      Objective:   Physical Exam  Constitutional: He is oriented to person, place, and time. He appears well-developed and well-nourished. No distress.  HENT:  Head: Normocephalic and atraumatic.  Right Ear: External ear normal.  Left Ear: External ear normal.  Nose: Nose normal.  Mouth/Throat: Oropharynx is clear and moist. No oropharyngeal exudate.  Eyes: Conjunctivae and EOM are normal. Pupils are equal, round, and reactive to light. Right eye exhibits no discharge. Left eye exhibits no discharge.  No scleral icterus.  Neck: Normal range of motion. Neck supple. No JVD present. No tracheal deviation present. No thyromegaly present.  Cardiovascular: Normal rate, regular rhythm, normal heart sounds and intact distal pulses.  Exam reveals no gallop and no friction rub.   No murmur heard. Pulmonary/Chest: Effort normal and breath sounds normal. No stridor. No respiratory distress. He has no wheezes. He has no rales. He exhibits no tenderness.  Abdominal: Soft. Bowel sounds are normal. He exhibits no distension and no mass. There is no tenderness. There is no rebound and no guarding.  Genitourinary: Penis normal.  Musculoskeletal: Normal range of motion. He exhibits no edema or tenderness.  Lymphadenopathy:    He has no cervical adenopathy.  Neurological: He is alert and oriented to person, place, and time. He has normal reflexes. He displays normal reflexes. No cranial nerve deficit. He exhibits normal muscle tone. Coordination normal.  Skin: Skin is warm. No rash noted. He is not diaphoretic. No erythema. No pallor.  Psychiatric: He has a normal mood and affect. His behavior is normal. Judgment and thought content normal.  Vitals reviewed.         Assessment & Plan:  Routine general medical examination at a health care facility  Patient's physical exam is normal. His lab work is excellent. Regular anticipatory guidance is provided. I recommended a flu shot as well as a tetanus vaccine which the patient declined. 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

## 2014-12-21 ENCOUNTER — Ambulatory Visit: Payer: 59 | Admitting: Family Medicine

## 2014-12-27 ENCOUNTER — Ambulatory Visit: Payer: 59 | Admitting: Family Medicine

## 2014-12-29 ENCOUNTER — Encounter: Payer: Self-pay | Admitting: Family Medicine

## 2015-09-24 ENCOUNTER — Emergency Department (HOSPITAL_COMMUNITY)
Admission: EM | Admit: 2015-09-24 | Discharge: 2015-09-24 | Disposition: A | Discharge status: Home / Self Care | Payer: No Typology Code available for payment source | Attending: Emergency Medicine | Admitting: Emergency Medicine

## 2015-09-24 ENCOUNTER — Encounter (HOSPITAL_COMMUNITY): Payer: Self-pay | Admitting: Emergency Medicine

## 2015-09-24 DIAGNOSIS — S4991XA Unspecified injury of right shoulder and upper arm, initial encounter: Secondary | ICD-10-CM | POA: Diagnosis not present

## 2015-09-24 DIAGNOSIS — Y92410 Unspecified street and highway as the place of occurrence of the external cause: Secondary | ICD-10-CM | POA: Insufficient documentation

## 2015-09-24 DIAGNOSIS — S199XXA Unspecified injury of neck, initial encounter: Secondary | ICD-10-CM | POA: Insufficient documentation

## 2015-09-24 DIAGNOSIS — Y998 Other external cause status: Secondary | ICD-10-CM | POA: Insufficient documentation

## 2015-09-24 DIAGNOSIS — S4992XA Unspecified injury of left shoulder and upper arm, initial encounter: Secondary | ICD-10-CM | POA: Diagnosis not present

## 2015-09-24 DIAGNOSIS — S0990XA Unspecified injury of head, initial encounter: Secondary | ICD-10-CM | POA: Diagnosis present

## 2015-09-24 DIAGNOSIS — T148XXA Other injury of unspecified body region, initial encounter: Secondary | ICD-10-CM

## 2015-09-24 DIAGNOSIS — Y9389 Activity, other specified: Secondary | ICD-10-CM | POA: Insufficient documentation

## 2015-09-24 DIAGNOSIS — S3992XA Unspecified injury of lower back, initial encounter: Secondary | ICD-10-CM | POA: Diagnosis not present

## 2015-09-24 DIAGNOSIS — Z87891 Personal history of nicotine dependence: Secondary | ICD-10-CM | POA: Insufficient documentation

## 2015-09-24 DIAGNOSIS — T148 Other injury of unspecified body region: Secondary | ICD-10-CM | POA: Diagnosis not present

## 2015-09-24 MED ORDER — IBUPROFEN 800 MG PO TABS: 800.0000 mg | ORAL_TABLET | Freq: Three times a day (TID) | ORAL | Status: DC

## 2015-09-24 MED ORDER — CYCLOBENZAPRINE HCL 10 MG PO TABS: 10.0000 mg | ORAL_TABLET | Freq: Three times a day (TID) | ORAL | Status: DC
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
## 2015-09-26 ENCOUNTER — Encounter: Payer: Self-pay | Admitting: Family Medicine

## 2015-10-17 IMAGING — CT CT NECK W/ CM
4 of 5 series · 15 of 33 positions shown, 18 images · IV contrast (APPLIED)
Comparison: 06/19/2014

CLINICAL DATA: Continued throat pressure after recent dental
abscess. Interval resolution of facial swelling.

EXAM:
CT NECK WITH CONTRAST
TECHNIQUE: Multidetector CT imaging of the neck was performed using the
standard protocol following the bolus administration of intravenous
contrast.
CONTRAST:  75mL OMNIPAQUE IOHEXOL 300 MG/ML  SOLN

[Series 2: neck 2.0 i31s 3 · axial · 0.48mm/px · z∈[-258,-96]mm · 5 of 123 slices shown, 7 images]
[im 21/123  soft-tissue]
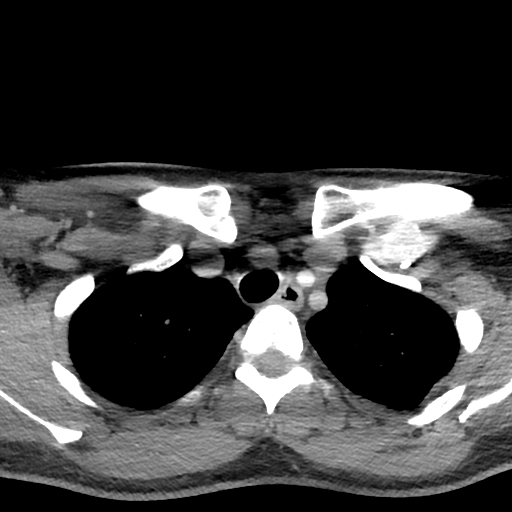
[im 21/123  bone]
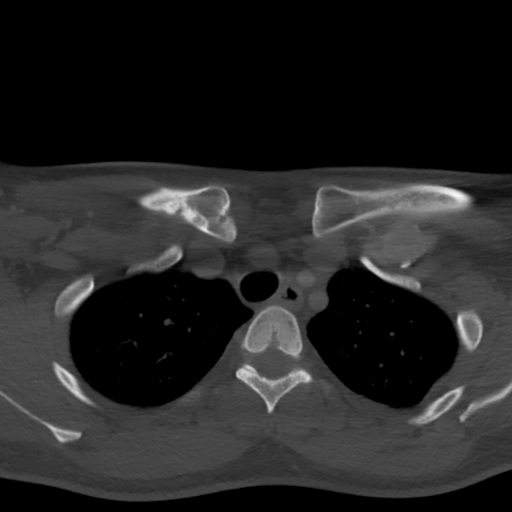
[im 41/123  bone]
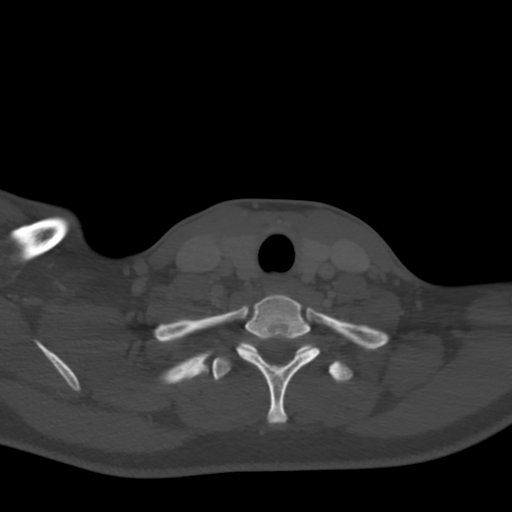
[im 62/123  bone]
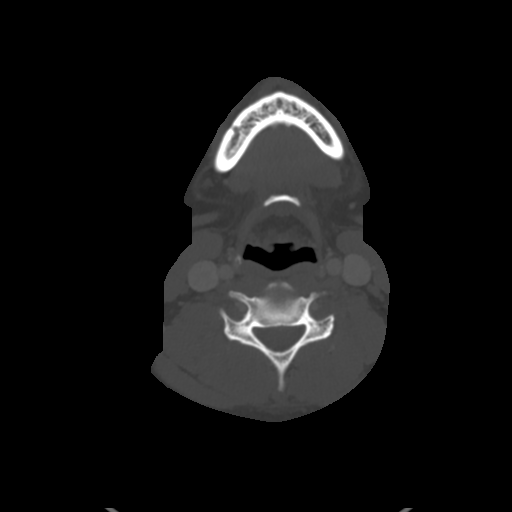
[im 82/123  bone]
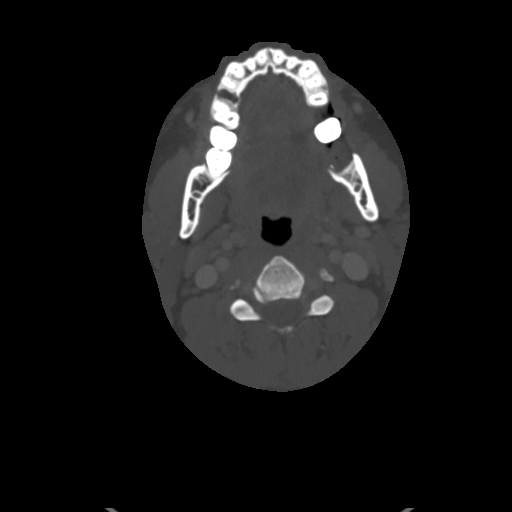
[im 102/123  soft-tissue]
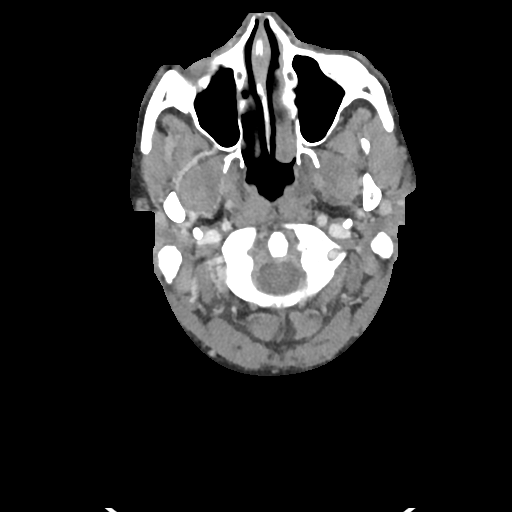
[im 102/123  bone]
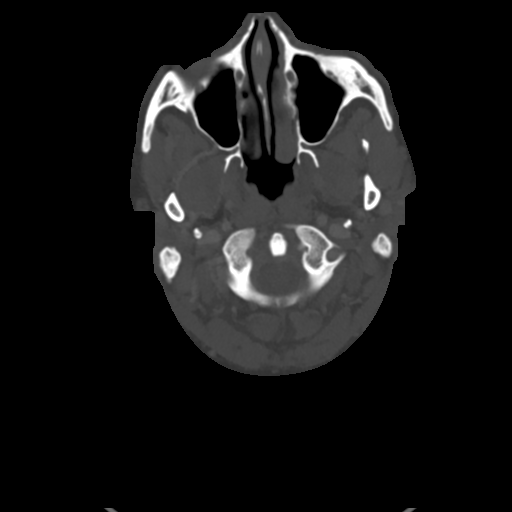

[Series 602: <mpr thick range> · coronal · 0.48mm/px · 3 of 62 slices shown]
[im 13/62  bone]
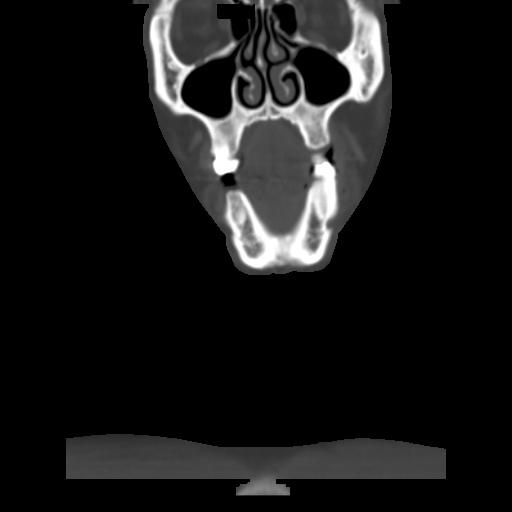
[im 25/62  bone]
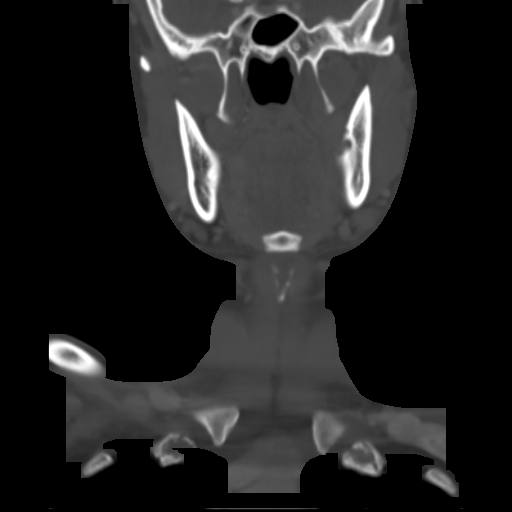
[im 37/62  bone]
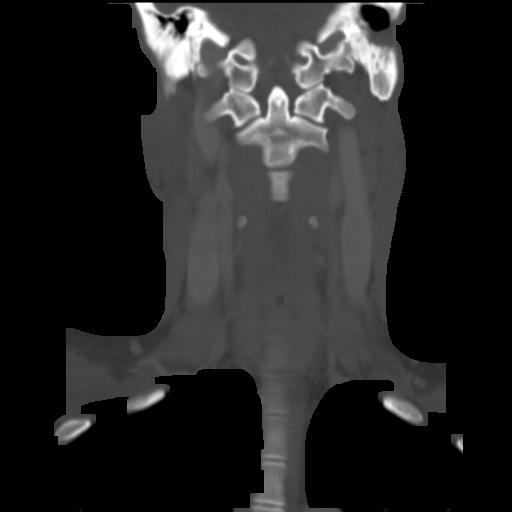

[Series 603: <mpr thick range(1)> · axial · 0.48mm/px · z∈[-281,-216]mm · 2 of 90 slices shown]
[im 23/90  bone]
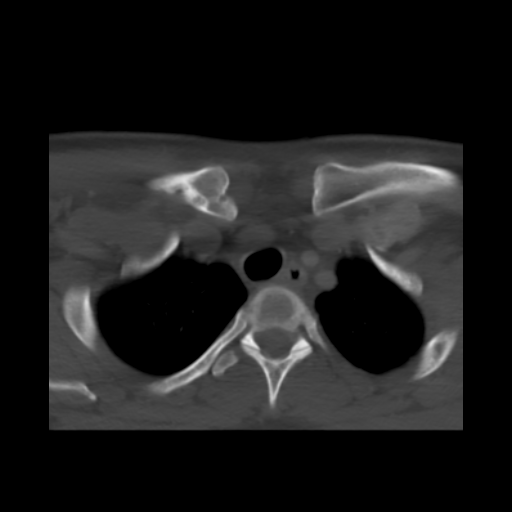
[im 45/90  bone]
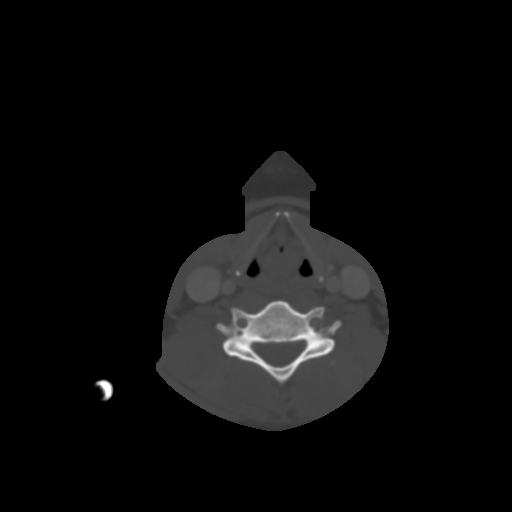

[Series 604: <mpr thick range(2)> · sagittal · 0.48mm/px · 5 of 64 slices shown, 6 images]
[im 22/64  bone]
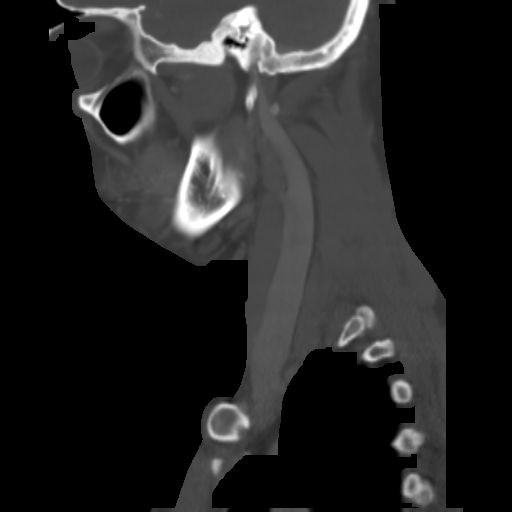
[im 27/64  bone]
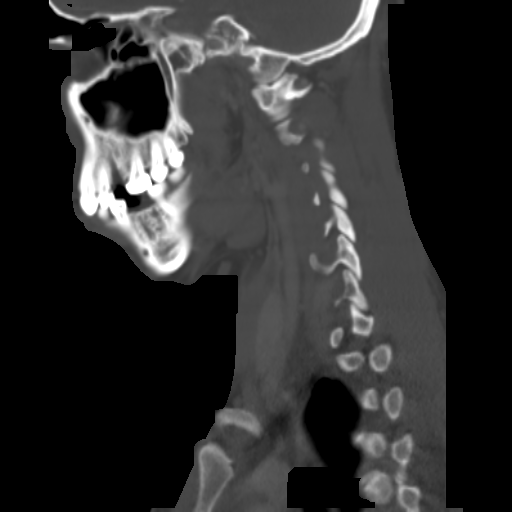
[im 32/64  soft-tissue]
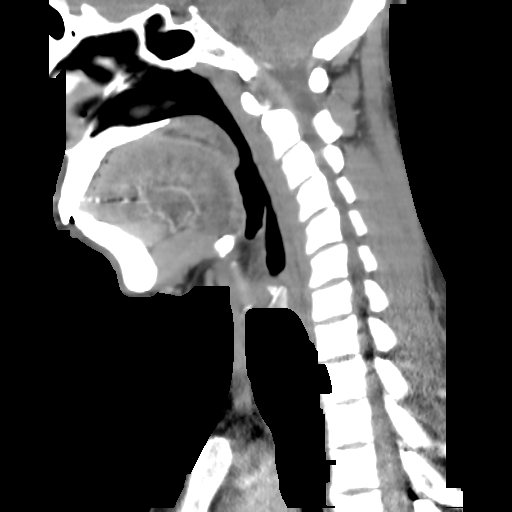
[im 32/64  bone]
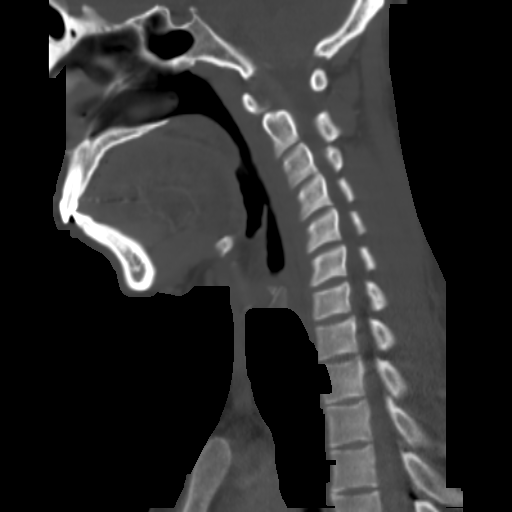
[im 37/64  bone]
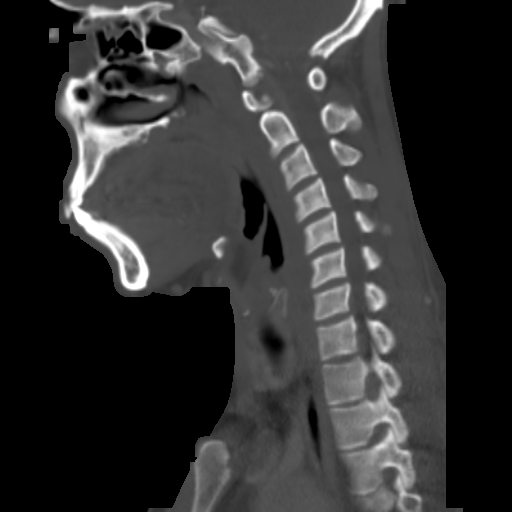
[im 43/64  bone]
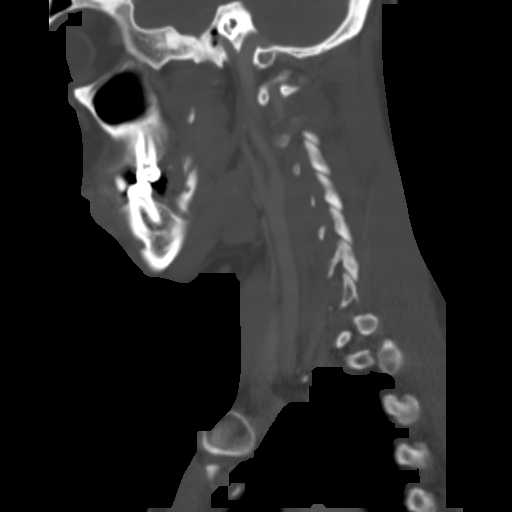

[15 of 33 positions shown; findings below may reference images not displayed]

FINDINGS: The visualized portion of the brain is unremarkable. Orbits are
unremarkable. Mild mucosal thickening is noted in the right sphenoid
sinus and left ethmoid air cells. Sequelae of prior left canal wall
down mastoidectomy are again identified. Opacification of residual
inferior left-sided mastoid air cells is unchanged.

The nasopharynx is unremarkable. Mild asymmetric soft tissue
fullness of the left palatine tonsillar region is similar to the
prior study without a discrete mass seen. There has been interval
left second and third mandibular molar extraction with prominent
lucency and small amount of gas in the mandible at the extraction
sites. The tiny subperiosteal gas and fluid collection along the
medial aspect of the left mandible on the prior study is no longer
seen. No new fluid collection is identified. Inflammatory changes in
the left floor of mouth and submandibular space have largely
resolved.

The right mandibular second molar has also been extracted in the
interim. Additionally, the right maxillary second bicuspid has also
been extracted, and there is a very small amount of slightly
asymmetric edema/fluid extending along the adjacent lingual surface
of the maxilla measuring up to 4 mm in thickness (series 2, image
44). No drainable fluid collection is identified.

The submandibular, parotid, and thyroid glands are normal in
appearance. Level I and II lymph nodes on the prior study have
decreased in size and are now subcentimeter. Major vascular
structures of the neck are patent. Visualized lung apices are clear.
IMPRESSION: Postoperative changes from extraction of multiple teeth as above,
with prominent defect in the left mandible at the site of second and
third molar extraction. Tiny subperiosteal collection on the prior
study has resolved, and surrounding inflammatory changes have also
largely resolved.

## 2016-05-05 ENCOUNTER — Encounter: Payer: Self-pay | Admitting: Family Medicine

## 2018-10-21 ENCOUNTER — Emergency Department (HOSPITAL_COMMUNITY): Payer: Self-pay

## 2018-10-21 ENCOUNTER — Emergency Department (HOSPITAL_COMMUNITY)
Admission: EM | Admit: 2018-10-21 | Discharge: 2018-10-21 | Disposition: A | Discharge status: Home / Self Care | Payer: Self-pay | Attending: Emergency Medicine | Admitting: Emergency Medicine

## 2018-10-21 ENCOUNTER — Other Ambulatory Visit: Payer: Self-pay

## 2018-10-21 ENCOUNTER — Encounter (HOSPITAL_COMMUNITY): Payer: Self-pay | Admitting: Pharmacy Technician

## 2018-10-21 DIAGNOSIS — F10230 Alcohol dependence with withdrawal, uncomplicated: Secondary | ICD-10-CM | POA: Insufficient documentation

## 2018-10-21 DIAGNOSIS — F10930 Alcohol use, unspecified with withdrawal, uncomplicated: Secondary | ICD-10-CM

## 2018-10-21 DIAGNOSIS — Z87891 Personal history of nicotine dependence: Secondary | ICD-10-CM | POA: Insufficient documentation

## 2018-10-21 DIAGNOSIS — B370 Candidal stomatitis: Secondary | ICD-10-CM | POA: Insufficient documentation

## 2018-10-21 DIAGNOSIS — R0602 Shortness of breath: Secondary | ICD-10-CM | POA: Insufficient documentation

## 2018-10-21 LAB — COMPREHENSIVE METABOLIC PANEL
ALBUMIN: 4.7 g/dL (ref 3.5–5.0)
ALT: 49 U/L — AB (ref 0–44)
AST: 55 U/L — ABNORMAL HIGH (ref 15–41)
Alkaline Phosphatase: 47 U/L (ref 38–126)
Anion gap: 11 (ref 5–15)
BUN: 10 mg/dL (ref 6–20)
CO2: 26 mmol/L (ref 22–32)
CREATININE: 1.19 mg/dL (ref 0.61–1.24)
Calcium: 9.6 mg/dL (ref 8.9–10.3)
Chloride: 103 mmol/L (ref 98–111)
GFR calc Af Amer: >60 mL/min (ref >60)
GFR calc non Af Amer: >60 mL/min (ref >60)
GLUCOSE: 108 mg/dL — AB (ref 70–99)
Potassium: 3.8 mmol/L (ref 3.5–5.1)
Sodium: 140 mmol/L (ref 135–145)
TOTAL PROTEIN: 8.2 g/dL — AB (ref 6.5–8.1)
Total Bilirubin: 1.2 mg/dL (ref 0.3–1.2)

## 2018-10-21 LAB — URINALYSIS, ROUTINE W REFLEX MICROSCOPIC
Bilirubin Urine: NEGATIVE
Glucose, UA: NEGATIVE mg/dL
Ketones, ur: 20 mg/dL — AB
Leukocytes, UA: NEGATIVE
NITRITE: NEGATIVE
PROTEIN: 100 mg/dL — AB
Specific Gravity, Urine: 1.032 — ABNORMAL HIGH (ref 1.005–1.030)
pH: 5 (ref 5.0–8.0)

## 2018-10-21 LAB — CBC
HCT: 46.2 % (ref 39.0–52.0)
HEMOGLOBIN: 16.2 g/dL (ref 13.0–17.0)
MCH: 31.3 pg (ref 26.0–34.0)
MCHC: 35.1 g/dL (ref 30.0–36.0)
MCV: 89.4 fL (ref 80.0–100.0)
Platelets: 206 10*3/uL (ref 150–400)
RBC: 5.17 MIL/uL (ref 4.22–5.81)
RDW: 11.6 % (ref 11.5–15.5)
WBC: 6.1 10*3/uL (ref 4.0–10.5)
nRBC: 0 % (ref 0.0–0.2)

## 2018-10-21 LAB — RAPID HIV SCREEN (HIV 1/2 AB+AG)
HIV 1/2 ANTIBODIES: NONREACTIVE
HIV-1 P24 Antigen - HIV24: NONREACTIVE

## 2018-10-21 LAB — I-STAT TROPONIN, ED: Troponin i, poc: 0 ng/mL (ref 0.00–0.08)

## 2018-10-21 LAB — I-STAT CG4 LACTIC ACID, ED: Lactic Acid, Venous: 0.99 mmol/L (ref 0.5–1.9)

## 2018-10-21 LAB — LIPASE, BLOOD: Lipase: 35 U/L (ref 11–51)

## 2018-10-21 MED ORDER — HYDROCODONE-ACETAMINOPHEN 5-325 MG PO TABS
1.0000 | ORAL_TABLET | Freq: Once | ORAL | Status: AC
  Administered 2018-10-21: 1 via ORAL
  Filled 2018-10-21: qty 1

## 2018-10-21 MED ORDER — NYSTATIN 100000 UNIT/ML MT SUSP: 5.0000 mL | Freq: Four times a day (QID) | OROMUCOSAL | 0 refills | Status: AC

## 2018-10-21 NOTE — ED Notes (Signed)
Pt stable, ambulatory, states understanding of discharge instructions 

## 2018-10-21 NOTE — ED Triage Notes (Signed)
Pt arrives via POV with reports of decreased po intake over the last week. States he has a rash on his tongue. Pt states he normally drinks liquor daily and quit drinking on Sunday. Pt in NAD.

## 2018-10-21 NOTE — ED Provider Notes (Signed)
[facility] Emergency Department Provider Note MRN:  [id number]  Arrival date & time: 10/21/18     Chief Complaint   Alcohol Problem   History of Present Illness   Nicholas Lee is a 37 y.o. year-old male with a history of alcohol abuse presenting to the ED with chief complaint of alcohol problem.  Patient explains that he has been drinking very heavily for several weeks to months.  He quit drinking 3 days ago cold [place].  2 days ago he felt very tremulous, had a single episode of possible visual hallucinations when he was trying to sleep, he thought he saw a person running across the room.  Also endorsing generalized malaise, fatigue, low energy, mild epigastric abdominal pain.  Also endorsing oral thrush.  Review of Systems  A complete 10 system review of systems was obtained and all systems are negative except as noted in the HPI and PMH.   Patient's Health History    Past Medical History:  Diagnosis Date  . Smoker     Past Surgical History:  Procedure Laterality Date  . TYMPANOSTOMY TUBE PLACEMENT     left ear in 90's    Family History  Problem Relation Age of Onset  . Heart disease Maternal Grandfather        38 y/o  . Heart disease Mother     Social History   Socioeconomic History  . Marital status: Single    Spouse name: Not on file  . Number of children: Not on file  . Years of education: Not on file  . Highest education level: Not on file  Occupational History  . Not on file  Social Needs  . Financial resource strain: Not on file  . Food insecurity:    Worry: Not on file    Inability: Not on file  . Transportation needs:    Medical: Not on file    Non-medical: Not on file  Tobacco Use  . Smoking status: Former Smoker    Packs/day: 0.25    Types: Cigarettes  . Tobacco comment: 1 pack per week  Substance and Sexual Activity  . Alcohol use: Yes    Comment: 3-4 days per week  . Drug use: No  . Sexual activity: Yes    Comment:  married, works at [employer] in [employer]  Lifestyle  . Physical activity:    Days per week: Not on file    Minutes per session: Not on file  . Stress: Not on file  Relationships  . Social connections:    Talks on phone: Not on file    Gets together: Not on file    Attends religious service: Not on file    Active member of club or organization: Not on file    Attends meetings of clubs or organizations: Not on file    Relationship status: Not on file  . Intimate partner violence:    Fear of current or ex partner: Not on file    Emotionally abused: Not on file    Physically abused: Not on file    Forced sexual activity: Not on file  Other Topics Concern  . Not on file  Social History Narrative  . Not on file     Physical Exam  Vital Signs and Nursing Notes reviewed Vitals:   10/21/18 1536  BP: (!) 152/103  Pulse: 81  Resp: 16  Temp: 98.5 F (36.9 C)  SpO2: 97%    CONSTITUTIONAL: Well-appearing, NAD NEURO:  Alert and oriented x 3, no focal deficits EYES:  eyes equal and reactive ENT/NECK:  no LAD, no JVD; thrush of the tongue CARDIO: Regular rate, well-perfused, normal S1 and S2 PULM:  CTAB no wheezing or rhonchi GI/GU:  normal bowel sounds, non-distended, non-tender MSK/SPINE:  No gross deformities, no edema SKIN:  no rash, atraumatic PSYCH:  Appropriate speech and behavior  Diagnostic and Interventional Summary    EKG Interpretation  Date/Time:    Ventricular Rate:    PR Interval:    QRS Duration:   QT Interval:    QTC Calculation:   R Axis:     Text Interpretation:        Labs Reviewed  COMPREHENSIVE METABOLIC PANEL - Abnormal; Notable for the following components:      Result Value   Glucose, Bld 108 (*)    Total Protein 8.2 (*)    AST 55 (*)    ALT 49 (*)    All other components within normal limits  URINALYSIS, ROUTINE W REFLEX MICROSCOPIC - Abnormal; Notable for the following components:   Color, Urine AMBER (*)    Specific Gravity,  Urine 1.032 (*)    Hgb urine dipstick SMALL (*)    Ketones, ur 20 (*)    Protein, ur 100 (*)    Bacteria, UA RARE (*)    All other components within normal limits  CBC  LIPASE, BLOOD  RAPID HIV SCREEN (HIV 1/2 AB+AG)  I-STAT TROPONIN, ED  I-STAT CG4 LACTIC ACID, ED  GC/CHLAMYDIA PROBE AMP ([place]) NOT AT [facility]    DG Chest 2 View  Final Result      Medications  HYDROcodone-acetaminophen (NORCO/VICODIN) 5-325 MG per tablet 1 tablet (1 tablet Oral Given 10/21/18 1753)     Procedures Critical Care  ED Course and Medical Decision Making  I have reviewed the triage vital signs and the nursing notes.  Pertinent labs & imaging results that were available during my care of the patient were reviewed by me and considered in my medical decision making (see below for details).  Symptoms consistent with alcohol withdrawal, which it seems he has gone through completely and is now detox.  Patient is currently with normal vital signs, no tremulousness, no active hallucinations currently.  Oral thrush on exam brings into question immunosuppression.  Labs unremarkable, including negative HIV rapid testing.  Pancreatitis considered but lipase unremarkable.  Provided reassurance, will follow-up with PCP.  Prescription for nystatin.   Elmer SowMichael M. Pilar PlateBero, MD [facility] [email or web address]  Final Clinical Impressions(s) / ED Diagnoses     ICD-10-CM   1. Alcohol withdrawal syndrome without complication (HCC) F10.230   2. SOB (shortness of breath) R06.02 DG Chest 2 View    DG Chest 2 View  3. Thrush B37.0     ED Discharge Orders         Ordered    nystatin (MYCOSTATIN) 100000 UNIT/ML suspension  4 times daily     10/21/18 1944             Sabas SousBero, Daton Szilagyi M, MD 10/21/18 1944

## 2018-10-21 NOTE — Discharge Instructions (Signed)
You were evaluated in the Emergency Department and after careful evaluation, we did not find any emergent condition requiring admission or further testing in the hospital.  Your recent symptoms today seem to be due to alcohol withdrawal, which she seem to have completed.  Your weight loss and poor appetite should continue to be evaluated by your primary care doctor.  Please return to the Emergency Department if you experience any worsening of your condition.  We encourage you to follow up with a primary care provider.  Thank you for allowing us to be a part of your care.

## 2020-01-31 IMAGING — DX DG CHEST 2V
2 series · 2 of 2 positions shown · non-contrast
Comparison: None.

CLINICAL DATA: Shortness of breath for 1 week.

EXAM:
CHEST - 2 VIEW

[w chest pa]
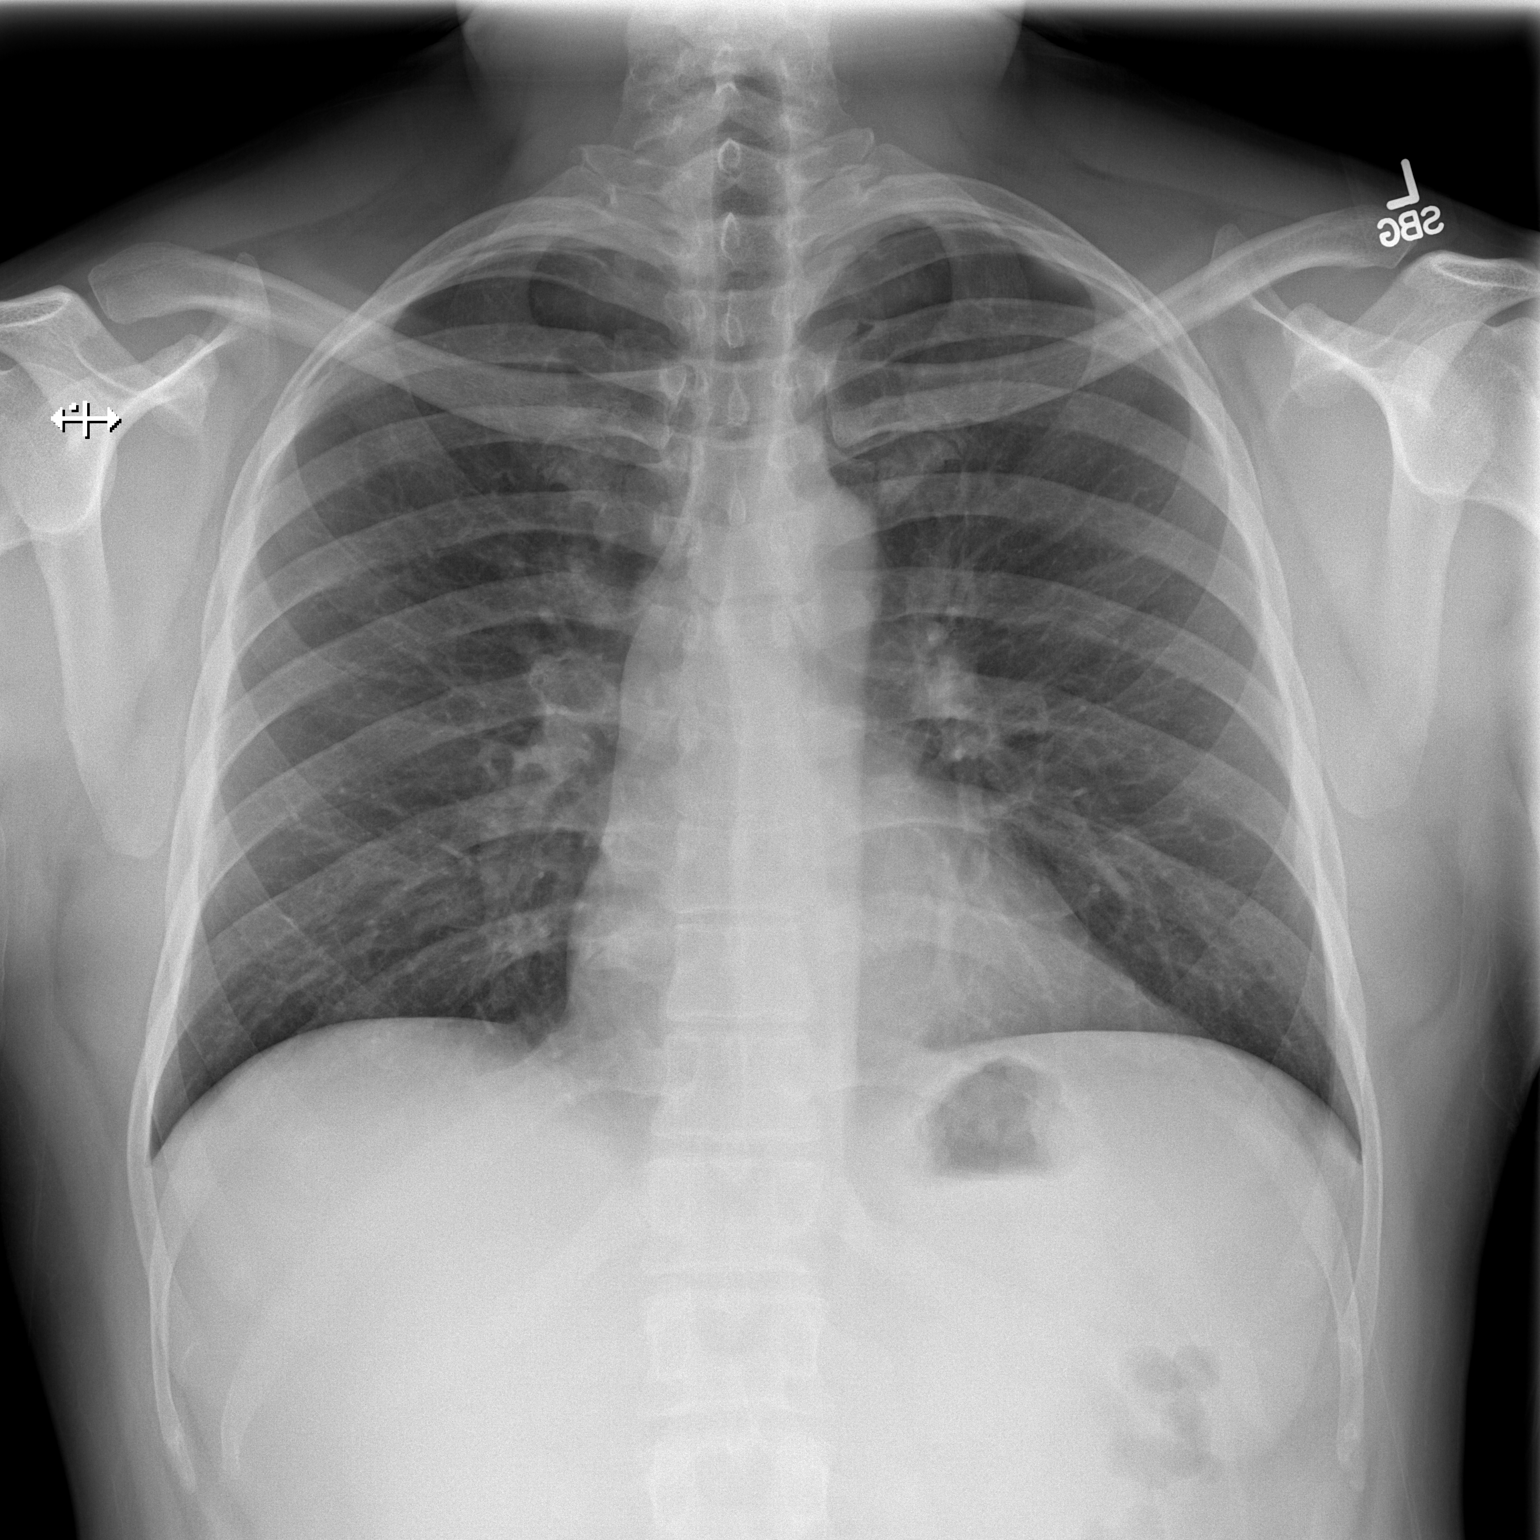

[w chest lat]
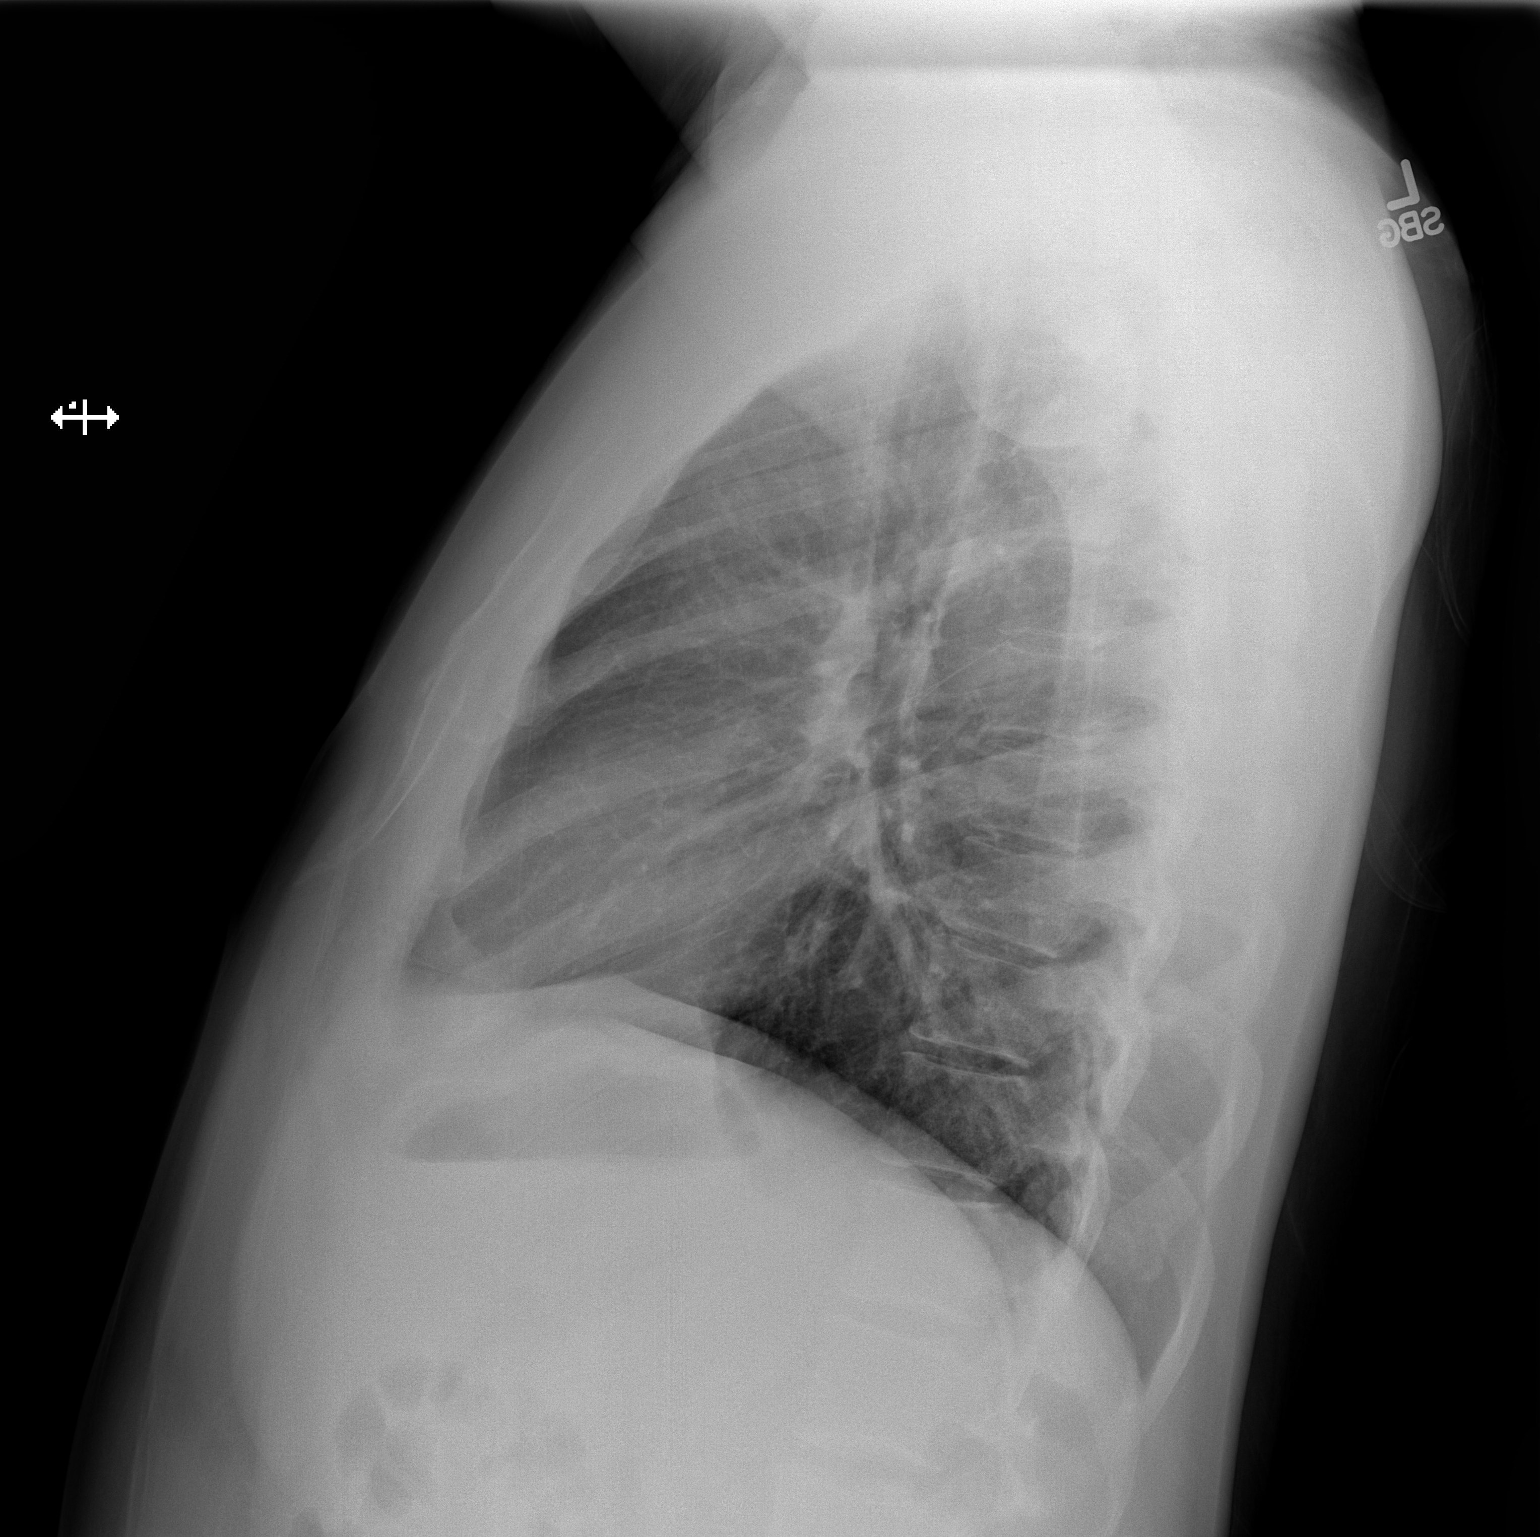

[2 of 2 positions shown; findings below may reference images not displayed]

FINDINGS: The heart size and mediastinal contours are within normal limits.
Both lungs are clear. The visualized skeletal structures are
unremarkable.
IMPRESSION: Negative.  No active cardiopulmonary disease.

## 2020-04-12 ENCOUNTER — Other Ambulatory Visit: Payer: Self-pay

## 2020-04-12 ENCOUNTER — Emergency Department (HOSPITAL_COMMUNITY)
Admission: EM | Admit: 2020-04-12 | Discharge: 2020-04-13 | Disposition: A | Discharge status: Home / Self Care | Payer: Medicaid Other | Attending: Emergency Medicine | Admitting: Emergency Medicine

## 2020-04-12 ENCOUNTER — Encounter (HOSPITAL_COMMUNITY): Payer: Self-pay | Admitting: Emergency Medicine

## 2020-04-12 DIAGNOSIS — K029 Dental caries, unspecified: Secondary | ICD-10-CM | POA: Insufficient documentation

## 2020-04-12 DIAGNOSIS — K0889 Other specified disorders of teeth and supporting structures: Secondary | ICD-10-CM

## 2020-04-12 DIAGNOSIS — Z87891 Personal history of nicotine dependence: Secondary | ICD-10-CM | POA: Insufficient documentation

## 2020-04-12 NOTE — ED Triage Notes (Signed)
Pt c/o left sided mouth pain.

## 2020-04-13 MED ORDER — NAPROXEN 500 MG PO TABS: 500.0000 mg | ORAL_TABLET | Freq: Two times a day (BID) | ORAL | 0 refills | Status: DC

## 2020-04-13 MED ORDER — AMOXICILLIN 500 MG PO CAPS: 500.0000 mg | ORAL_CAPSULE | Freq: Two times a day (BID) | ORAL | 0 refills | Status: AC

## 2020-04-13 MED ORDER — NAPROXEN 250 MG PO TABS
500.0000 mg | ORAL_TABLET | Freq: Once | ORAL | Status: AC
  Administered 2020-04-13: 500 mg via ORAL
  Filled 2020-04-13: qty 2

## 2020-04-13 MED ORDER — AMOXICILLIN 500 MG PO CAPS
500.0000 mg | ORAL_CAPSULE | Freq: Once | ORAL | Status: AC
  Administered 2020-04-13: 500 mg via ORAL

## 2020-04-13 MED ORDER — AMOXICILLIN 500 MG PO CAPS
500.0000 mg | ORAL_CAPSULE | Freq: Three times a day (TID) | ORAL | Status: DC
  Filled 2020-04-13: qty 1

## 2020-04-13 NOTE — Discharge Instructions (Signed)
Follow up with dentistry. The one I have referred you to has a discount plan available for patients seen in the ED previously. Call tomorrow to schedule an appointment.   Take the antibiotics as prescribed.

## 2020-04-13 NOTE — ED Notes (Signed)
Discharge instructions discussed with pt. Pt verbalized understanding. Pt stable and ambulatory. No signature pad available. 

## 2020-04-13 NOTE — ED Provider Notes (Signed)
[facility] EMERGENCY DEPARTMENT Provider Note   CSN: [id number] Arrival date & time: 04/12/20  2339    History Chief Complaint  Patient presents with  . Dental Pain   ABDULKAREEM Lee is a 39 y.o. male with no significant past medical history who presents for evaluation of dental pain and swelling. Started yesterday. Not followed by dentistry. No associated CP, SOB, arm pain, diaphoresis. Noted mild left facial swelling.  No drooling, dysphagia, trismus.  Tolerating p.o. intake without difficulty.  No headache or lightheadedness, dizziness, neck pain, neck stiffness.  Rates his pain a 3/10.  Denies additional aggravating or relieving factors.  History obtained from patient and past medical records.  No interpreter used.  HPI     Past Medical History:  Diagnosis Date  . Smoker     Patient Active Problem List   Diagnosis Date Noted  . Dental abscess 06/19/2014  . Oral inflammation 06/19/2014  . Smoker     Past Surgical History:  Procedure Laterality Date  . TYMPANOSTOMY TUBE PLACEMENT     left ear in 90's       Family History  Problem Relation Age of Onset  . Heart disease Maternal Grandfather        53 y/o  . Heart disease Mother     Social History   Tobacco Use  . Smoking status: Former Smoker    Packs/day: 0.25    Types: Cigarettes  . Tobacco comment: 1 pack per week  Substance Use Topics  . Alcohol use: Yes    Comment: 3-4 days per week  . Drug use: No    Home Medications Prior to Admission medications   Medication Sig Start Date End Date Taking? Authorizing Provider  amoxicillin (AMOXIL) 500 MG capsule Take 1 capsule (500 mg total) by mouth 2 (two) times daily for 5 days. 04/13/20 04/18/20  Virdia Ziesmer A, PA-C  cyclobenzaprine (FLEXERIL) 10 MG tablet Take 1 tablet (10 mg total) by mouth 3 (three) times daily. 09/24/15   Upstill, Melvenia Beam, PA-C  GINSENG PO Take 1 Bottle by mouth once a week.    [provider]  ibuprofen  (ADVIL,MOTRIN) 800 MG tablet Take 1 tablet (800 mg total) by mouth 3 (three) times daily. 09/24/15   Elpidio Anis, PA-C  naproxen (NAPROSYN) 500 MG tablet Take 1 tablet (500 mg total) by mouth 2 (two) times daily. 04/13/20   Anneth Brunell A, PA-C    Allergies    Patient has no known allergies.  Review of Systems   Review of Systems  Constitutional: Negative.   HENT: Positive for dental problem and facial swelling. Negative for congestion, ear discharge, ear pain, nosebleeds, postnasal drip, rhinorrhea, sinus pressure, sore throat, trouble swallowing and voice change.   Respiratory: Negative.   Cardiovascular: Negative.   Gastrointestinal: Negative for nausea and vomiting.  Musculoskeletal: Negative.   Skin: Negative.   Neurological: Negative.   All other systems reviewed and are negative.   Physical Exam Updated Vital Signs BP (!) 153/117 (BP Location: Right Arm)   Pulse 61   Temp 98.4 F (36.9 C) (Oral)   Resp 18   SpO2 100%   Physical Exam Vitals and nursing note reviewed.  Constitutional:      General: He is not in acute distress.    Appearance: He is well-developed. He is not ill-appearing, toxic-appearing or diaphoretic.  HENT:     Head: Normocephalic and atraumatic.     Jaw: There is normal jaw occlusion.  Comments: Minimal left facial edema without erythema. No submandibular edema, induration. No ludwig's angina    Mouth/Throat:     Lips: Pink.     Mouth: Mucous membranes are moist.     Dentition: Dental tenderness and dental caries present.     Pharynx: Oropharynx is clear. Uvula midline.     Tonsils: No tonsillar exudate or tonsillar abscesses. 0 on the right. 0 on the left.      Comments: No drooling, dysphagia or trismus.  Sublingual area soft.  Uvula midline.  Dental caries present.  Mild gingival erythema to left lower dentition.  No gross periapical abscess Eyes:     Pupils: Pupils are equal, round, and reactive to light.  Neck:     Trachea:  Trachea and phonation normal.     Comments: No neck stiffness or neck rigidity Cardiovascular:     Rate and Rhythm: Normal rate and regular rhythm.     Pulses: Normal pulses.     Heart sounds: Normal heart sounds.  Pulmonary:     Effort: Pulmonary effort is normal. No respiratory distress.  Abdominal:     General: There is no distension.     Palpations: Abdomen is soft.  Musculoskeletal:        General: Normal range of motion.     Cervical back: Full passive range of motion without pain, normal range of motion and neck supple.  Skin:    General: Skin is warm and dry.     Capillary Refill: Capillary refill takes less than 2 seconds.     Comments: No edema, erythema or warmth  Neurological:     Mental Status: He is alert.    ED Results / Procedures / Treatments   Labs (all labs ordered are listed, but only abnormal results are displayed) Labs Reviewed - No data to display  EKG None  Radiology No results found.  Procedures Procedures (including critical care time)  Medications Ordered in ED Medications  naproxen (NAPROSYN) tablet 500 mg (500 mg Oral Given 04/13/20 0335)  amoxicillin (AMOXIL) capsule 500 mg (500 mg Oral Given 04/13/20 0335)   ED Course  I have reviewed the triage vital signs and the nursing notes.  Pertinent labs & imaging results that were available during my care of the patient were reviewed by me and considered in my medical decision making (see chart for details).  39 year old male presents for evaluation of dental pain.  Very minimal left mandibular swelling however no extension into the submandibular area.  No induration, fluctuance or overlying erythema.  No evidence of deep space infection or Ludwig's angina.  Does have multiple dental caries.  Mild gingival erythema to the left lower dentition however no large drainable periapical abscess.  Sublingual area soft.  No drooling, dysphagia or trismus. Will dc home with ABX, NSAIDS. First dose given in ED.  Discussed follow up outpatient with dentistry.  The patient has been appropriately medically screened and/or stabilized in the ED. I have low suspicion for any other emergent medical condition which would require further screening, evaluation or treatment in the ED or require inpatient management.  Patient is hemodynamically stable and in no acute distress.  Patient able to ambulate in department prior to ED.  Evaluation does not show acute pathology that would require ongoing or additional emergent interventions while in the emergency department or further inpatient treatment.  I have discussed the diagnosis with the patient and answered all questions.  Pain is been managed while in the emergency department  and patient has no further complaints prior to discharge.  Patient is comfortable with plan discussed in room and is stable for discharge at this time.  I have discussed strict return precautions for returning to the emergency department.  Patient was encouraged to follow-up with PCP/specialist refer to at discharge.    MDM Rules/Calculators/A&P                       Final Clinical Impression(s) / ED Diagnoses Final diagnoses:  Pain, dental    Rx / DC Orders ED Discharge Orders         Ordered    amoxicillin (AMOXIL) 500 MG capsule  2 times daily     04/13/20 0325    naproxen (NAPROSYN) 500 MG tablet  2 times daily     04/13/20 0325           Raiya Stainback A, PA-C 04/13/20 0335    Zadie Rhine, MD 04/13/20 2312

## 2022-05-21 ENCOUNTER — Ambulatory Visit (INDEPENDENT_AMBULATORY_CARE_PROVIDER_SITE_OTHER): Payer: Managed Care, Other (non HMO) | Admitting: Family Medicine

## 2022-05-21 ENCOUNTER — Encounter: Payer: Self-pay | Admitting: Family Medicine

## 2022-05-21 VITALS — BP 126/92 | HR 84 | Ht 71.0 in | Wt 221.0 lb

## 2022-05-21 DIAGNOSIS — Z1159 Encounter for screening for other viral diseases: Secondary | ICD-10-CM | POA: Diagnosis not present

## 2022-05-21 DIAGNOSIS — Z13220 Encounter for screening for lipoid disorders: Secondary | ICD-10-CM | POA: Diagnosis not present

## 2022-05-21 DIAGNOSIS — R030 Elevated blood-pressure reading, without diagnosis of hypertension: Secondary | ICD-10-CM

## 2022-05-21 DIAGNOSIS — Z114 Encounter for screening for human immunodeficiency virus [HIV]: Secondary | ICD-10-CM

## 2022-05-21 NOTE — Patient Instructions (Signed)
Good to see you today - Thank you for coming in  Things we discussed today:  Your blood pressure was a little high today. One way to help lower blood pressure is to lose weight. I would recommend working on diet and exercise to help with weight loss. I also recommend trying to cut down on your caffeine intake.   We will also be checking routine blood labs, including HepC, HIV, and cholesterol. If there are any abnormal results, I will reach out via phone call. Otherwise, I will reach out via MyChart or send you a letter.  For your heartburn, continue taking Pepcid.   Come back to see me in 6 months, or sooner if you need.

## 2022-05-21 NOTE — Assessment & Plan Note (Signed)
BP elevated to 134/96, then 126/94 recheck. Advised weight loss and decreasing caffeine intake and repeating BP at next visit. If still elevated, pt is willing to start meds.

## 2022-05-21 NOTE — Progress Notes (Signed)
    SUBJECTIVE:   CHIEF COMPLAINT / HPI: Establishing care  ET is a 79M with no significant PMHx that presents to establish care. Reports a right back mass that he first noted 1 week ago. It is not painful.  PERTINENT  PMH / PSH:  - L eustachian tube (1990s)  FH: Mom passed from MI  Social Hx: Works in truck driving. Lives with wife and 3 kids. Has 6 beers/wk on weekends. Remote hx of smoking for 15 yrs but stopped in 2013. Denies other drug use.   OBJECTIVE:   BP (!) 126/92   Pulse 84   Ht 5\' 11"  (1.803 m)   Wt 221 lb (100.2 kg)   SpO2 100%   BMI 30.82 kg/m   Gen: Well appearing middle aged man, sitting comfortably CV: RRR Pulm: CTAB, norm WOB MSK: R back subQ mass, nonfluctuant, nontender.  ASSESSMENT/PLAN:   Elevated blood pressure reading BP elevated to 134/96, then 126/94 recheck. Advised weight loss and decreasing caffeine intake and repeating BP at next visit. If still elevated, pt is willing to start meds.   , MD [facility]

## 2022-05-22 LAB — LIPID PANEL
Chol/HDL Ratio: 2.7 ratio (ref 0.0–5.0)
Cholesterol, Total: 130 mg/dL (ref 100–199)
HDL: 49 mg/dL (ref >39)
LDL Chol Calc (NIH): 54 mg/dL (ref 0–99)
Triglycerides: 163 mg/dL — ABNORMAL HIGH (ref 0–149)
VLDL Cholesterol Cal: 27 mg/dL (ref 5–40)

## 2022-05-22 LAB — HIV ANTIBODY (ROUTINE TESTING W REFLEX): HIV Screen 4th Generation wRfx: NONREACTIVE

## 2022-05-22 LAB — HCV INTERPRETATION

## 2022-05-22 LAB — HCV AB W REFLEX TO QUANT PCR: HCV Ab: NONREACTIVE

## 2022-10-19 DIAGNOSIS — Z419 Encounter for procedure for purposes other than remedying health state, unspecified: Secondary | ICD-10-CM | POA: Diagnosis not present

## 2022-11-19 DIAGNOSIS — Z419 Encounter for procedure for purposes other than remedying health state, unspecified: Secondary | ICD-10-CM | POA: Diagnosis not present

## 2022-12-20 DIAGNOSIS — Z419 Encounter for procedure for purposes other than remedying health state, unspecified: Secondary | ICD-10-CM | POA: Diagnosis not present

## 2023-01-18 DIAGNOSIS — Z419 Encounter for procedure for purposes other than remedying health state, unspecified: Secondary | ICD-10-CM | POA: Diagnosis not present

## 2023-01-21 ENCOUNTER — Encounter: Payer: Self-pay | Admitting: Family Medicine

## 2023-01-21 ENCOUNTER — Ambulatory Visit (INDEPENDENT_AMBULATORY_CARE_PROVIDER_SITE_OTHER): Payer: Managed Care, Other (non HMO) | Admitting: Family Medicine

## 2023-01-21 VITALS — BP 136/94 | HR 94 | Ht 71.0 in | Wt 228.6 lb

## 2023-01-21 DIAGNOSIS — Z23 Encounter for immunization: Secondary | ICD-10-CM

## 2023-01-21 DIAGNOSIS — R030 Elevated blood-pressure reading, without diagnosis of hypertension: Secondary | ICD-10-CM

## 2023-01-21 DIAGNOSIS — Z131 Encounter for screening for diabetes mellitus: Secondary | ICD-10-CM

## 2023-01-21 DIAGNOSIS — Z0000 Encounter for general adult medical examination without abnormal findings: Secondary | ICD-10-CM | POA: Diagnosis not present

## 2023-01-21 DIAGNOSIS — R229 Localized swelling, mass and lump, unspecified: Secondary | ICD-10-CM | POA: Diagnosis not present

## 2023-01-21 HISTORY — DX: Localized swelling, mass and lump, unspecified: R22.9

## 2023-01-21 NOTE — Progress Notes (Signed)
    SUBJECTIVE:   Chief compliant/HPI: annual examination  Nicholas Lee is a 42 y.o. who presents today for an annual exam.   Was seen in clinic in July to establish care.  At the time blood pressure was elevated, and it was discussed that his monitor and follow-up, and potentially start medication. BP elevated today feels it may be related to high salt intake last night. No caffeine this morning. Has not checked blood pressure at home.   Currently not taking medications. Would like blood work today. Is fasting    Does have a lump on his right side he wants looked at. Denies pain. Not getting larger.   Drinks alcohol, not daily but on the weekend. Will drink about 3 beers and a couple shots of liquor.  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 No other exercise   Eats a balanced diet. Denies eating a lot of junk food.  Does drink about 1 red bull a day   Denies concern about anxiety depresion   OBJECTIVE:   BP (!) 136/94   Pulse 94   Ht '5\' 11"'$  (1.803 m)   Wt 228 lb 9.6 oz (103.7 kg)   SpO2 99%   BMI 31.88 kg/m    Physical exam General: well appearing, NAD Cardiovascular: RRR, no murmurs Lungs: CTAB. Normal WOB Abdomen: soft, non-distended, non-tender Skin: warm, dry. No edema. On R flank area approx 4 cm subcutaneous mass without erythema    ASSESSMENT/PLAN:   Skin mass On R flank area approx 4 cm subcutaneous mass. No skin changes. Likely lipoma. Will monitor. Return precautions discussed if it were to start causing pain, increasing in size, or anything else concerning.    Annual Examination  See AVS for recommendations.  PHQ score 0, reviewed.  Blood pressure value is above goal. 136/94. Not on medication. Recommended following up with pharmacy team for ambulatory blood pressure monitoring.   Considered the following screening exams based upon USPSTF recommendations: Diabetes screening: ordered Screening for elevated  cholesterol: discussed Hepatitis C:  Previously ordered at last visit and normal  Syphilis if at high risk:  declined  Declines STI testing  Will check CMP given alcohol use  Reviewed risk factors for latent tuberculosis and not indicated Colorectal cancer screening: not applicable given age.  if age 18 or over or risk factors.  Immunizations: Received Tdap today.    Follow up with pharmacy team in a week for ambulatory blood pressure monitoring    Shary Key, [place]

## 2023-01-21 NOTE — Assessment & Plan Note (Signed)
On R flank area approx 4 cm subcutaneous mass. No skin changes. Likely lipoma. Will monitor. Return precautions discussed if it were to start causing pain, increasing in size, or anything else concerning.

## 2023-01-21 NOTE — Patient Instructions (Addendum)
It was great seeing you today!  Today you came in for your physical exam and I am glad you are doing well!  On your way out please with our pharmacy team to set you up with ambulatory blood pressure monitoring.  This will allow [place] to see what your blood pressure is like at home to see if we need to start you on medication.  You got your Tetanus vaccine and we are also checking some blood work, I will call you if anything is abnormal or we will send a MyChart message if normal.  Please check-out at the front desk before leaving the clinic to set up the pharmacy appointment,  but if you need to be seen earlier than that for any new issues we're happy to fit you in, just give [place] a call!  Visit Reminders: - Stop by the pharmacy to pick up your prescriptions  - Continue to work on your healthy eating habits and incorporating exercise into your daily life.    Feel free to call with any questions or concerns at any time, at [phone].   Take care,  Dr. Shary Key [facility]

## 2023-01-22 LAB — COMPREHENSIVE METABOLIC PANEL
ALT: 31 IU/L (ref 0–44)
AST: 28 IU/L (ref 0–40)
Albumin/Globulin Ratio: 1.7 (ref 1.2–2.2)
Albumin: 4.7 g/dL (ref 4.1–5.1)
Alkaline Phosphatase: 74 IU/L (ref 44–121)
BUN/Creatinine Ratio: 13 (ref 9–20)
BUN: 15 mg/dL (ref 6–24)
Bilirubin Total: 0.4 mg/dL (ref 0.0–1.2)
CO2: 23 mmol/L (ref 20–29)
Calcium: 9.2 mg/dL (ref 8.7–10.2)
Chloride: 101 mmol/L (ref 96–106)
Creatinine, Ser: 1.12 mg/dL (ref 0.76–1.27)
Globulin, Total: 2.7 g/dL (ref 1.5–4.5)
Glucose: 96 mg/dL (ref 70–99)
Potassium: 4.2 mmol/L (ref 3.5–5.2)
Sodium: 141 mmol/L (ref 134–144)
Total Protein: 7.4 g/dL (ref 6.0–8.5)
eGFR: 85 mL/min/{1.73_m2} (ref >59)

## 2023-01-22 LAB — HEMOGLOBIN A1C
Est. average glucose Bld gHb Est-mCnc: 105 mg/dL
Hgb A1c MFr Bld: 5.3 % (ref 4.8–5.6)

## 2023-01-22 LAB — CBC
Hematocrit: 47.7 % (ref 37.5–51.0)
Hemoglobin: 16.7 g/dL (ref 13.0–17.7)
MCH: 31.3 pg (ref 26.6–33.0)
MCHC: 35 g/dL (ref 31.5–35.7)
MCV: 90 fL (ref 79–97)
Platelets: 145 10*3/uL — ABNORMAL LOW (ref 150–450)
RBC: 5.33 x10E6/uL (ref 4.14–5.80)
RDW: 12.5 % (ref 11.6–15.4)
WBC: 8.6 10*3/uL (ref 3.4–10.8)

## 2023-01-22 LAB — LIPID PANEL
Chol/HDL Ratio: 3.2 ratio (ref 0.0–5.0)
Cholesterol, Total: 152 mg/dL (ref 100–199)
HDL: 48 mg/dL
LDL Chol Calc (NIH): 78 mg/dL (ref 0–99)
Triglycerides: 151 mg/dL — ABNORMAL HIGH (ref 0–149)
VLDL Cholesterol Cal: 26 mg/dL (ref 5–40)

## 2023-02-18 DIAGNOSIS — Z419 Encounter for procedure for purposes other than remedying health state, unspecified: Secondary | ICD-10-CM | POA: Diagnosis not present

## 2023-03-19 ENCOUNTER — Ambulatory Visit: Payer: Managed Care, Other (non HMO) | Admitting: Family Medicine

## 2024-01-22 ENCOUNTER — Encounter: Payer: Managed Care, Other (non HMO) | Admitting: Family Medicine

## 2024-05-28 ENCOUNTER — Ambulatory Visit

## 2024-05-28 VITALS — BP 129/95 | HR 90 | Temp 98.1°F | Ht 71.0 in | Wt 236.4 lb

## 2024-05-28 DIAGNOSIS — H60393 Other infective otitis externa, bilateral: Secondary | ICD-10-CM | POA: Diagnosis not present

## 2024-05-28 DIAGNOSIS — H6593 Unspecified nonsuppurative otitis media, bilateral: Secondary | ICD-10-CM | POA: Diagnosis not present

## 2024-05-28 DIAGNOSIS — R030 Elevated blood-pressure reading, without diagnosis of hypertension: Secondary | ICD-10-CM

## 2024-05-28 DIAGNOSIS — J302 Other seasonal allergic rhinitis: Secondary | ICD-10-CM | POA: Diagnosis not present

## 2024-05-28 MED ORDER — CETIRIZINE HCL 10 MG PO TABS: 10.0000 mg | ORAL_TABLET | Freq: Every day | ORAL | 11 refills | Status: AC

## 2024-05-28 MED ORDER — FLUTICASONE PROPIONATE 50 MCG/ACT NA SUSP: 2.0000 | Freq: Every day | NASAL | 6 refills | Status: AC

## 2024-05-28 MED ORDER — AMOXICILLIN-POT CLAVULANATE 875-125 MG PO TABS
1.0000 | ORAL_TABLET | Freq: Two times a day (BID) | ORAL | 0 refills | Status: AC
Start: 2024-05-28 — End: 2024-06-02

## 2024-05-28 MED ORDER — CIPROFLOXACIN-DEXAMETHASONE 0.3-0.1 % OT SUSP: 4.0000 [drp] | Freq: Two times a day (BID) | OTIC | 0 refills | Status: AC

## 2024-05-28 NOTE — Patient Instructions (Signed)
 Thank you for visiting the clinic today, it was good to see you! In today's visit we discussed:  Ear infection: Because you have an inner and an outer ear infection we are going to use both an oral antibiotic and drops to put in your ears twice a day.  This should also help with your sinus pressure from the sinus infection.   Sinus infection: The antibiotics for the ear infection should help with this, but also consider using the cetirizine  and Flonase , as well as the Merrilyn Sieve to help with seasonal allergy symptoms.  This will help prevent ear and nose infections in the future.  Hypertension: Please buy a blood pressure cuff from your pharmacy, I would like you to have a blood pressure log for when you follow-up.  Record your blood pressure every morning after you wake up, and 2 other times throughout the day.  Please do so sitting with your feet flat on the floor and resting for more than 5 minutes, with your arm at your heart level resting on the surface.  Please follow-up in 1 month  For any questions, please call the office at [phone] or send me a message in MyChart. Have a great day!  -Fairy Amy, MD  [facility] Medicine Resident, PGY-1

## 2024-05-28 NOTE — Progress Notes (Signed)
    SUBJECTIVE:   CHIEF COMPLAINT / HPI:   Ear pain/sinus pain: Reports left ear pain and sinus pressure that started 1 week ago, pressure has increased in intensity, no fevers, no chills, no nausea, no vomiting.  The pain has since moved to his right ear as well, and he has had clear drainage which has gotten slightly cloudy and foul smelling over the last 2 days. He has had repeated inner ear infections that have resolved with amoxicillin .  He had ear tubes when he was a kid because of repeated ear infections.  Blood pressure: He has had repeatedly elevated systolic and diastolic blood pressures in the clinic for the last several years, with no formal ambulatory evaluation.  Has never tried any antihypertensives.  He reports it is quite responsive to what he eats, and does report eating potato chips last night.  PERTINENT  PMH / PSH: Reviewed  OBJECTIVE:   BP (!) 144/103   Pulse 90   Temp 98.1 F (36.7 C) (Oral)   Ht 5' 11 (1.803 m)   Wt 236 lb 6 oz (107.2 kg)   SpO2 99%   BMI 32.97 kg/m    Ear: Erythema and clear discharge with some pus of ear canals bilaterally, tympanic membrane erythematous and tense, not reflective to light, with some discharge around TM bilaterally. Nose: Frontal and maxillary sinuses nontender to palpation, no frank discharge. Cardiac: Regular rate and rhythm. Normal S1/S2. No murmurs, rubs, or gallops appreciated. Lungs: Clear bilaterally to ascultation.  Psych: Pleasant and appropriate    ASSESSMENT/PLAN:   Assessment & Plan Bilateral otitis media with effusion Started Augmentin  for 5 days, as well as Ciprodex  for 5 days.  Will return if needed for more follow-up, but will see in 1 month for blood pressure. Other infective acute otitis externa of both ears  Seasonal allergies Prescribed cetirizine  and Flonase , and instructed patient regarding irrigation with Nettie pot.  Reports symptoms are worse in the summer. Elevated blood pressure  reading Instructed patient to buy ambulatory blood pressure cuff, and record first thing after waking up, as well as 1 or 2 other readings throughout the day and to return in 1 month.     Fairy Amy, MD [facility]

## 2024-05-28 NOTE — Assessment & Plan Note (Signed)
 Instructed patient to buy ambulatory blood pressure cuff, and record first thing after waking up, as well as 1 or 2 other readings throughout the day and to return in 1 month.

## 2024-06-05 ENCOUNTER — Telehealth: Payer: Self-pay

## 2024-06-05 ENCOUNTER — Ambulatory Visit: Admitting: Student

## 2024-06-05 NOTE — Telephone Encounter (Signed)
 Patients wife calls nurse line requesting an apt.   She reports he was seen on 7/10 for ear pain and sinus issues. She reports the drops helped for his ears, however his sinus problems have not resolved. She reports not worse, just no improvement.   He completed Augmentin . She reports he is requesting prednisone. She reports he discussed this with the provider at visit, however he elected not to prescribe.   Patient was advised FU if no improvement.   Patient scheduled for this afternoon.

## 2024-06-22 ENCOUNTER — Ambulatory Visit

## 2024-07-24 ENCOUNTER — Encounter: Payer: Self-pay | Admitting: Family Medicine

## 2024-07-24 ENCOUNTER — Ambulatory Visit: Admitting: Family Medicine

## 2024-07-24 VITALS — BP 138/90 | HR 80 | Ht 68.0 in

## 2024-07-24 DIAGNOSIS — I10 Essential (primary) hypertension: Secondary | ICD-10-CM

## 2024-07-24 MED ORDER — VALSARTAN 40 MG PO TABS: 40.0000 mg | ORAL_TABLET | Freq: Every day | ORAL | 2 refills | Status: AC

## 2024-07-24 NOTE — Patient Instructions (Signed)
 Good to see you today - Thank you for coming in  Things we discussed today:  1) Your goal blood pressure is less than 120/80. Getting better control of your blood pressure can decrease your risk of severe heart and vascular disease such as strokes and heart attacks.  - Start taking Valsartan  40mg  once a day - Come back in 2 weeks and we will recheck your blood pressure  2) We will check your kindey labs, liver labs, and cholesterol.

## 2024-07-24 NOTE — Progress Notes (Signed)
 Established Patient Office Visit  Subjective   Patient ID: Nicholas Lee, male    DOB: [birth year]  Age: 43 y.o. MRN: [id number]  Chief Complaint  Patient presents with   Hypertension    HPI  (1) Hypertension  The patient is present for a follow-up for elevated blood pressure readings. He mentions that he has always been told that his blood pressure is slightly elevated during his healthcare visits. He was advised to monitor his blood pressure at home and he reports that his blood pressure has been ranging from 135 to 145 systolic and 80 to 90 diastolic. He reports that he has extensive history of high blood pressure in his family and mentions that multiple members have had heart attack or stroke. He denies any history of smoking or tobacco use. He mentions that his diet consist of combination of home-cooked meals, fast-food, or prepared food. Patient is aware of the importance of low-sodium dietary habits but mentions that it is hard to follow recommendations because of the prevalence of sodium in fast-food and prepared meals. He denies any recent episodes of headache, dizziness, vision changes, chest pain, shortness of breath, or palpitations. He mentions that he is willing to try controlling his blood-pressure with medication because working on his diet and lifestyle changes has not yielded desired results. No recent hospitalizations, ER visits, or medication changes. No new questions or concerns.    Review of Systems  Constitutional:  Negative for chills, fever and weight loss.  HENT:  Negative for tinnitus.   Eyes:  Negative for blurred vision and double vision.  Respiratory:  Negative for cough and shortness of breath.   Cardiovascular:  Negative for chest pain, palpitations and leg swelling.  Gastrointestinal:  Negative for abdominal pain, constipation, diarrhea and nausea.  Genitourinary:  Negative for dysuria.  Musculoskeletal:  Negative for joint pain and myalgias.  Neurological:   Negative for tingling, sensory change, weakness and headaches.     Objective:     BP (!) 138/90   Pulse 80   Ht 5' 8 (1.727 m)   SpO2 100%   BMI 35.94 kg/m    Physical Exam Constitutional:      General: He is not in acute distress.    Appearance: Normal appearance.  Cardiovascular:     Rate and Rhythm: Normal rate and regular rhythm.     Pulses: Normal pulses.     Heart sounds: Normal heart sounds. No murmur heard. Pulmonary:     Effort: Pulmonary effort is normal. No respiratory distress.     Breath sounds: Normal breath sounds.  Abdominal:     General: Abdomen is flat. There is no distension.     Palpations: Abdomen is soft.     Tenderness: There is no abdominal tenderness.  Neurological:     Mental Status: He is alert and oriented to person, place, and time.  Psychiatric:        Mood and Affect: Mood normal.        Behavior: Behavior normal.      The 10-year ASCVD risk score (Arnett DK, et al., 2019) is: 6.6%    Assessment & Plan:   Assessment & Plan Primary hypertension Seems chronic, never previously meds.  - Check Kidney function, lipids, electrolytes, and liver function.  -  Start Valsartan  40mg  once daily. - Counseled about ways to reduce sodium in diet and advised to monitor diet and increase exercise. - Advised to keep monitoring blood pressure at home and bring the  log for next visit.  - Talked about monitoring for signs of hypotension or allergic reactions and seeking care if any symptoms occur.  - RTC in 2 weeks for a blood pressure recheck and labs.   Dotty Blanch, Medical Student  [employer]  at [facility] 07/24/24 3:10 PM   I was personally present and performed or re-performed the history, physical exam and medical decision making activities of this service and have verified that the service and findings are accurately documented in the student's note.  Twyla Nearing, MD                  07/24/2024, 3:20 PM

## 2024-07-24 NOTE — Progress Notes (Deleted)
    SUBJECTIVE:   CHIEF COMPLAINT / HPI:   ***  ET is a 43 yo M that pf    BP  PERTINENT  PMH / PSH: ***  OBJECTIVE:   BP (!) 138/90   Pulse 80   Ht 5' 8 (1.727 m)   SpO2 100%   BMI 35.94 kg/m   ***  ASSESSMENT/PLAN:   Assessment & Plan Primary hypertension      Nicholas Nearing, MD [facility]

## 2024-07-25 LAB — COMPREHENSIVE METABOLIC PANEL WITH GFR
ALT: 30 IU/L (ref 0–44)
AST: 32 IU/L (ref 0–40)
Albumin: 4 g/dL — ABNORMAL LOW (ref 4.1–5.1)
Alkaline Phosphatase: 67 IU/L (ref 44–121)
BUN/Creatinine Ratio: 12 (ref 9–20)
BUN: 12 mg/dL (ref 6–24)
Bilirubin Total: 0.5 mg/dL (ref 0.0–1.2)
CO2: 25 mmol/L (ref 20–29)
Calcium: 9 mg/dL (ref 8.7–10.2)
Chloride: 101 mmol/L (ref 96–106)
Creatinine, Ser: 1 mg/dL (ref 0.76–1.27)
Globulin, Total: 2.5 g/dL (ref 1.5–4.5)
Glucose: 104 mg/dL — ABNORMAL HIGH (ref 70–99)
Potassium: 4.2 mmol/L (ref 3.5–5.2)
Sodium: 141 mmol/L (ref 134–144)
Total Protein: 6.5 g/dL (ref 6.0–8.5)
eGFR: 96 mL/min/1.73 (ref >59)

## 2024-07-25 LAB — LIPID PANEL
Chol/HDL Ratio: 3.2 ratio (ref 0.0–5.0)
Cholesterol, Total: 143 mg/dL (ref 100–199)
HDL: 45 mg/dL (ref >39)
LDL Chol Calc (NIH): 71 mg/dL (ref 0–99)
Triglycerides: 158 mg/dL — ABNORMAL HIGH (ref 0–149)
VLDL Cholesterol Cal: 27 mg/dL (ref 5–40)

## 2024-07-27 ENCOUNTER — Ambulatory Visit: Payer: Self-pay | Admitting: Family Medicine

## 2024-08-07 ENCOUNTER — Ambulatory Visit: Admitting: Family Medicine

## 2024-08-24 ENCOUNTER — Ambulatory Visit: Admitting: Family Medicine

## 2024-08-24 NOTE — Progress Notes (Deleted)
    SUBJECTIVE:   CHIEF COMPLAINT / HPI:   ***  ET is a 43yo M that pf HTN fu. - Was started on Valsartan  40mg  daily at last visit.    At goal? Taking? Side effects? BMP?  PERTINENT  PMH / PSH: ***  OBJECTIVE:   There were no vitals taken for this visit.  ***  ASSESSMENT/PLAN:   Assessment & Plan      Twyla Nearing, MD [facility]
